# Patient Record
Sex: Female | Born: 1985 | Hispanic: No | Marital: Married | Smoking: Never smoker
Health system: Southern US, Community
[De-identification: ages and names within clinical notes are randomized; demographics above are authoritative.]

---

## 2007-05-09 ENCOUNTER — Ambulatory Visit (HOSPITAL_COMMUNITY): Admission: RE | Admit: 2007-05-09 | Discharge: 2007-05-09 | Payer: Self-pay | Admitting: Obstetrics and Gynecology

## 2007-06-06 ENCOUNTER — Ambulatory Visit (HOSPITAL_COMMUNITY): Admission: RE | Admit: 2007-06-06 | Discharge: 2007-06-06 | Payer: Self-pay | Admitting: Obstetrics and Gynecology

## 2007-07-07 ENCOUNTER — Ambulatory Visit (HOSPITAL_COMMUNITY): Admission: RE | Admit: 2007-07-07 | Discharge: 2007-07-07 | Payer: Self-pay | Admitting: Obstetrics and Gynecology

## 2008-09-01 IMAGING — US US OB DETAIL+14 WK
1 series · 14 of 28 positions shown · non-contrast
Comparison: none

OBSTETRICAL ULTRASOUND:
 This ultrasound was performed in The [HOSPITAL], and the [facility] report will be stored to [REDACTED] PACS.

[Series 1: us ob detail+14 wk · 14 of 67 slices shown]
[im 3/67]
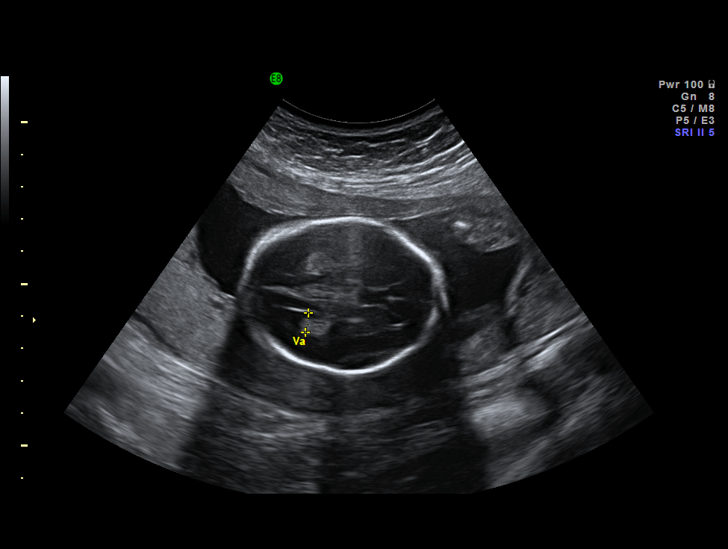
[im 8/67]
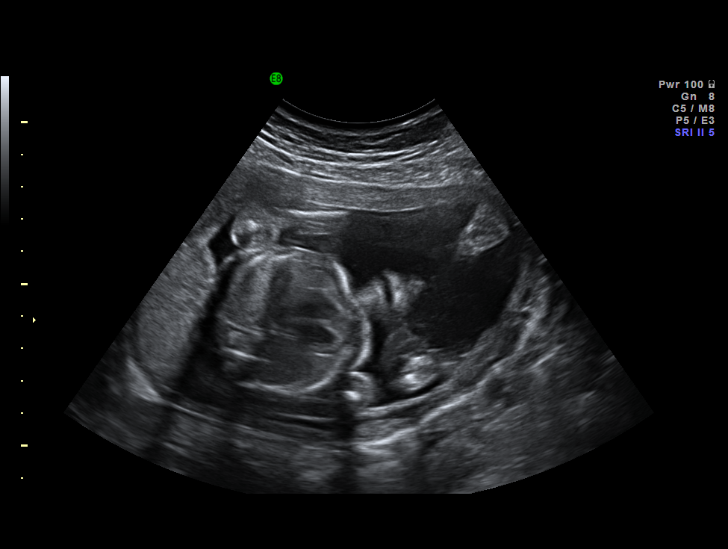
[im 13/67]
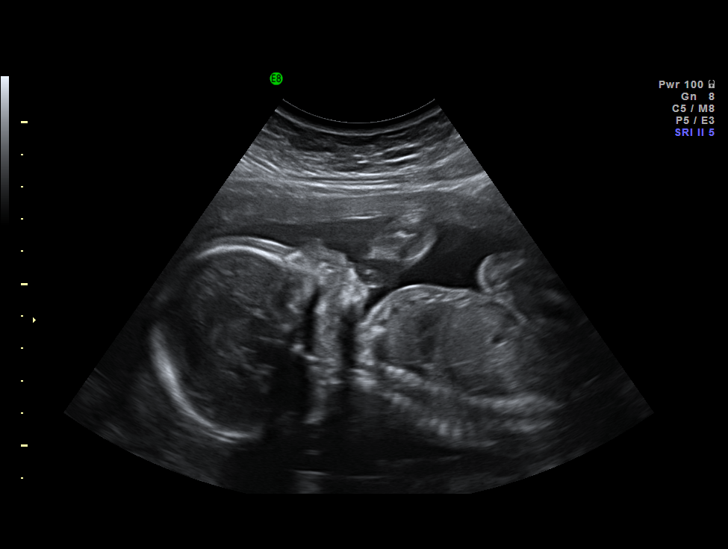
[im 18/67]
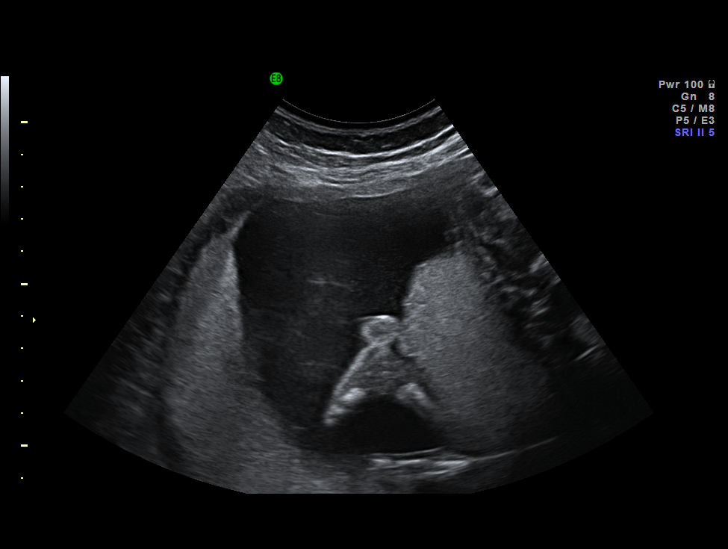
[im 23/67]
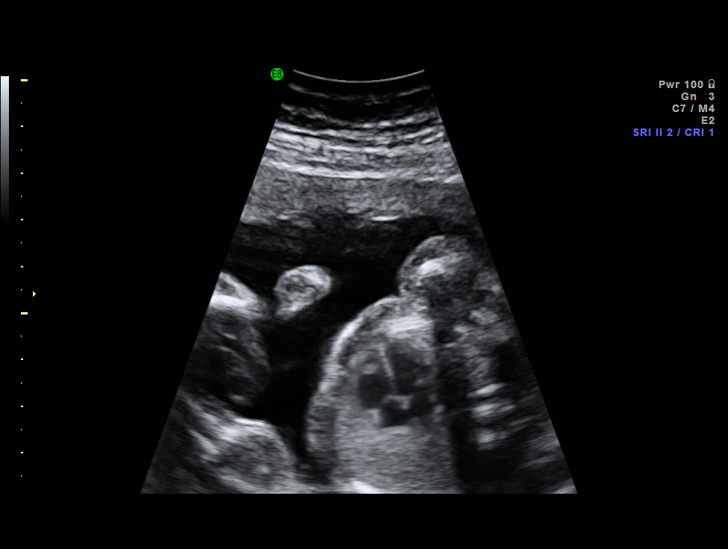
[im 27/67]
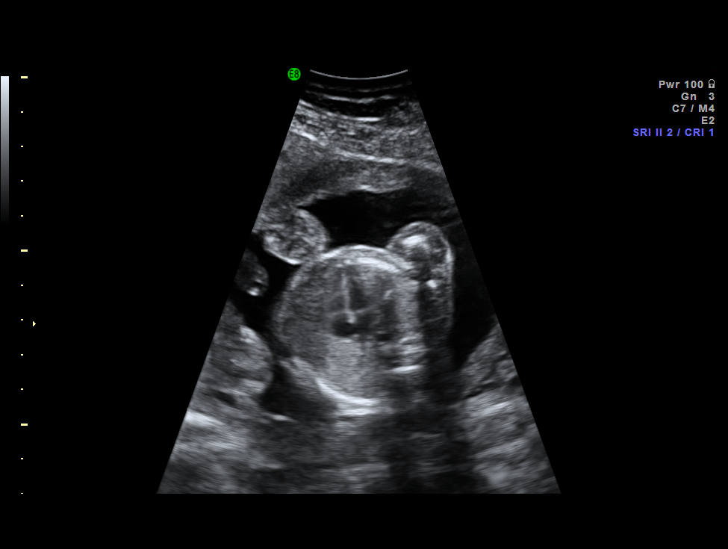
[im 32/67]
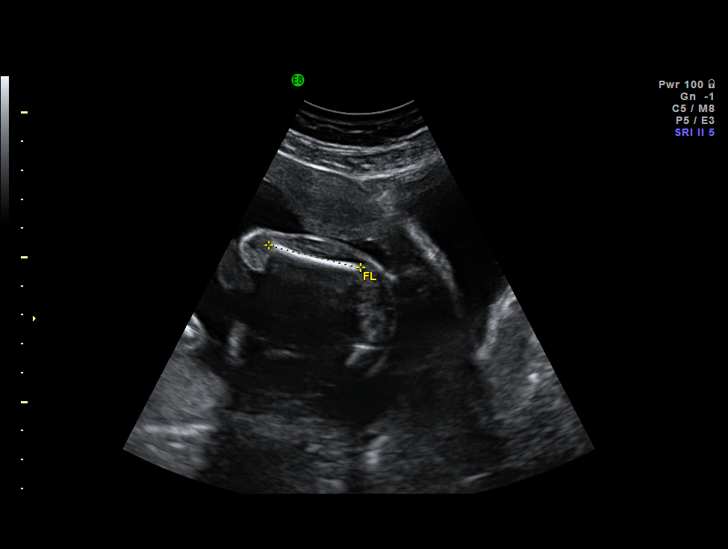
[im 37/67]
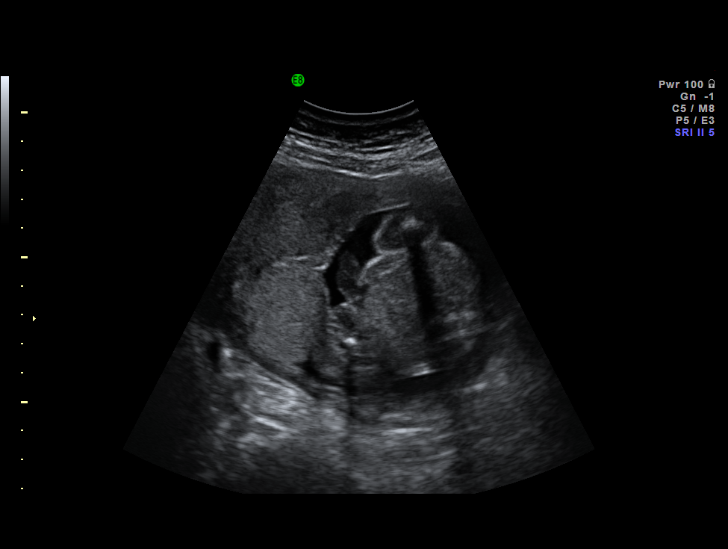
[im 42/67]
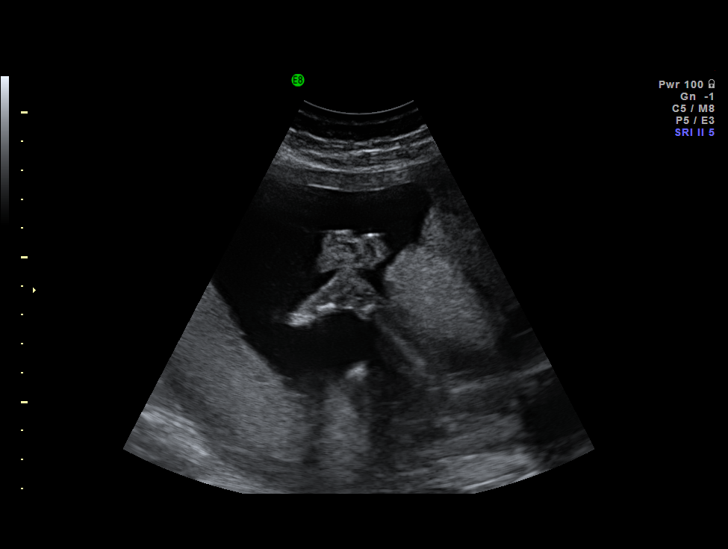
[im 47/67]
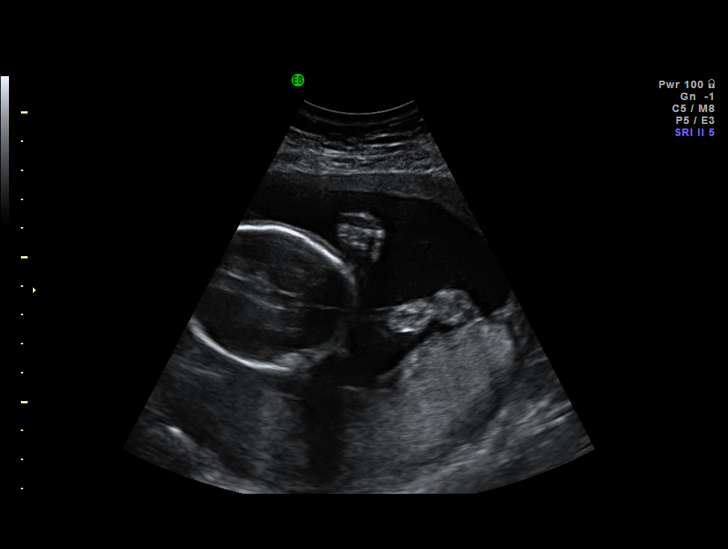
[im 52/67]
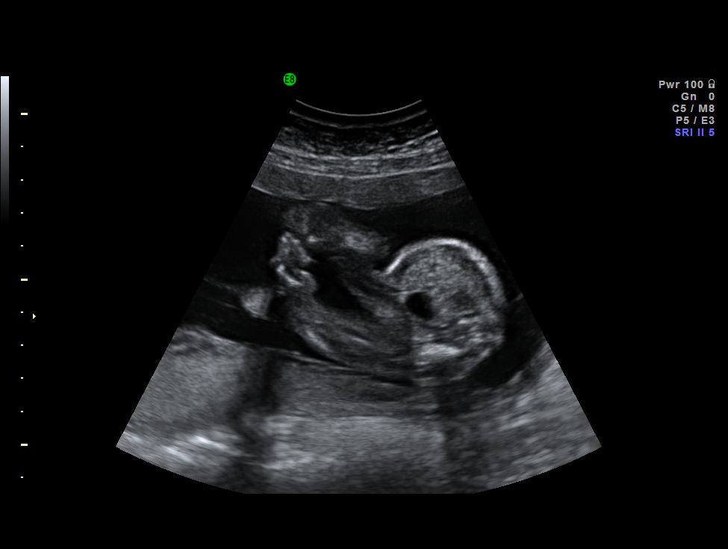
[im 57/67]
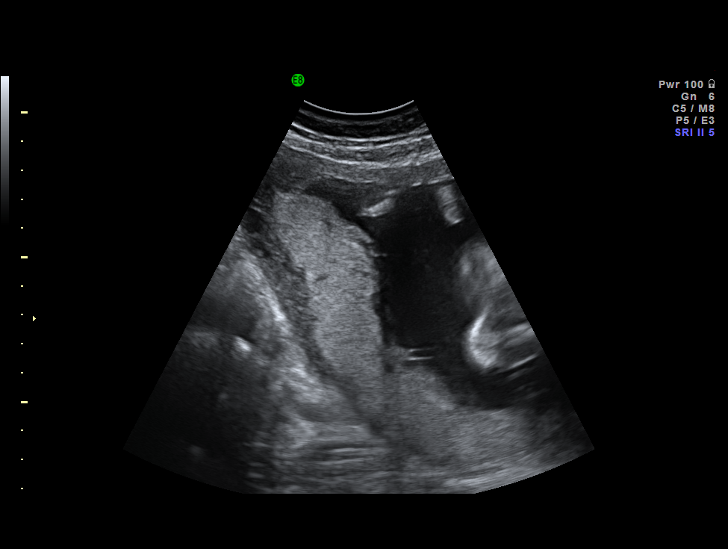
[im 62/67]
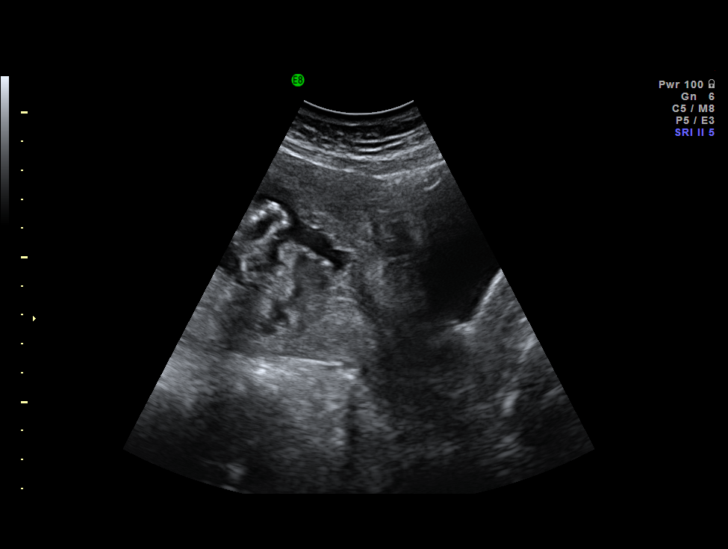
[im 67/67]
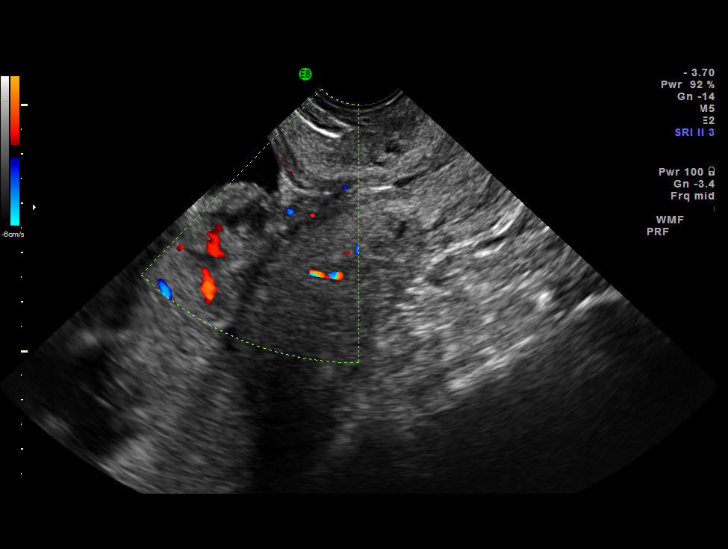

[14 of 28 positions shown; findings below may reference images not displayed]

IMPRESSION: The [facility] report has also been faxed to the ordering physician.

## 2008-10-30 IMAGING — US US OB FOLLOW-UP
1 series · 14 of 28 positions shown · non-contrast
Comparison: none

OBSTETRICAL ULTRASOUND:
 This ultrasound was performed in The [HOSPITAL], and the [facility] report will be stored to [REDACTED] PACS.

[Series 1: us ob follow-up · 14 of 38 slices shown]
[im 2/38]
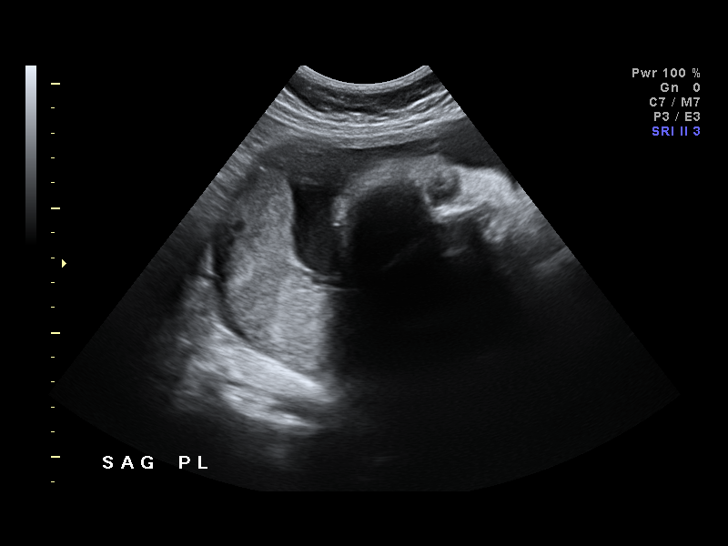
[im 5/38]
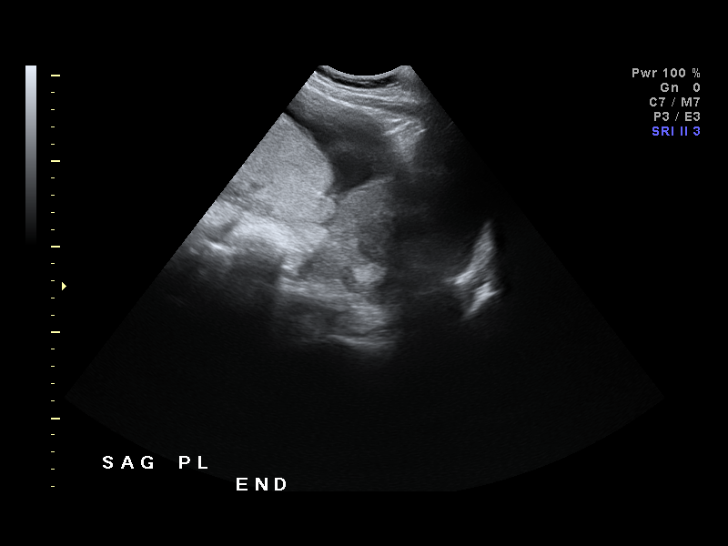
[im 7/38]
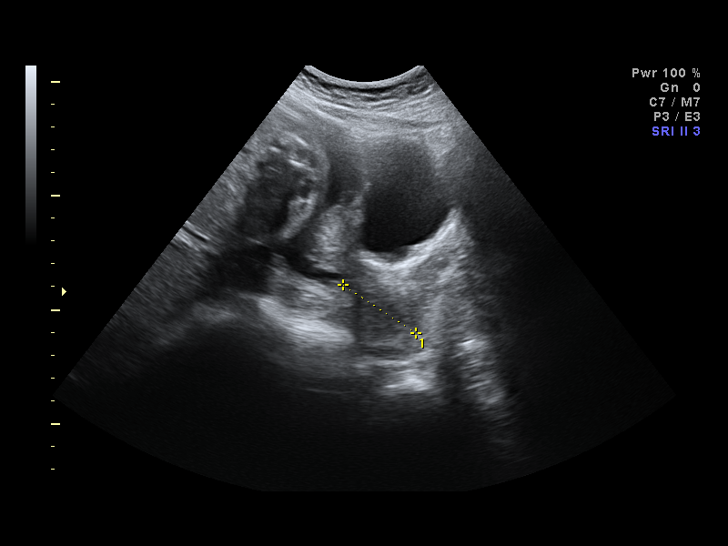
[im 10/38]
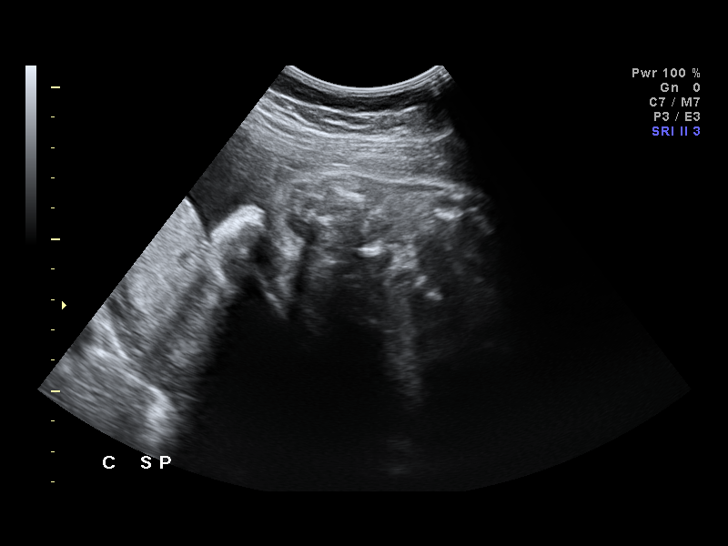
[im 13/38]
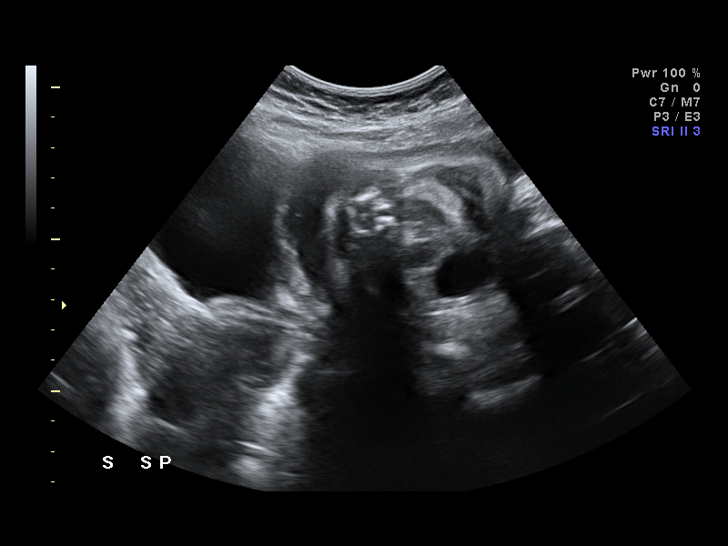
[im 16/38]
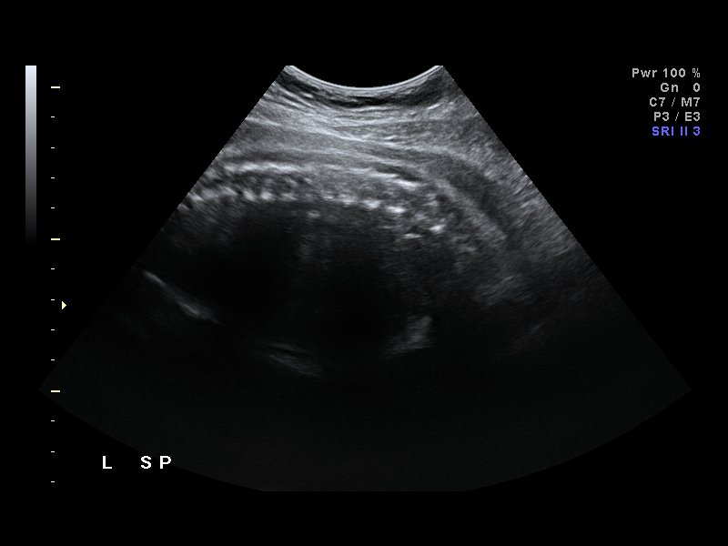
[im 18/38]
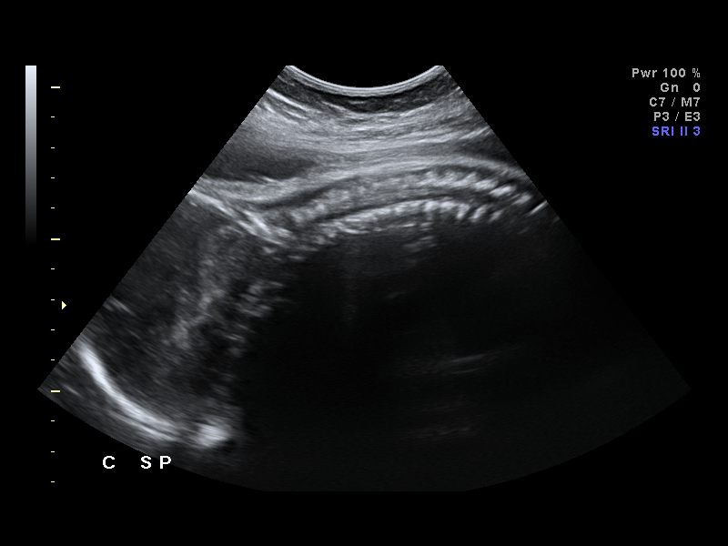
[im 21/38]
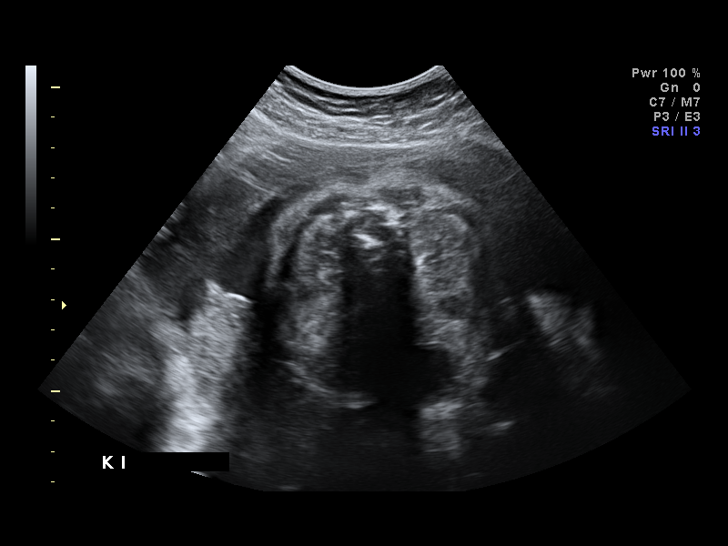
[im 24/38]
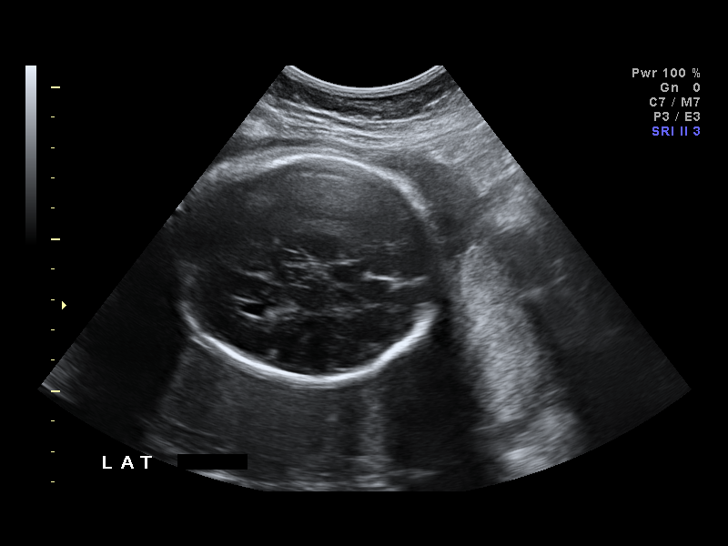
[im 27/38]
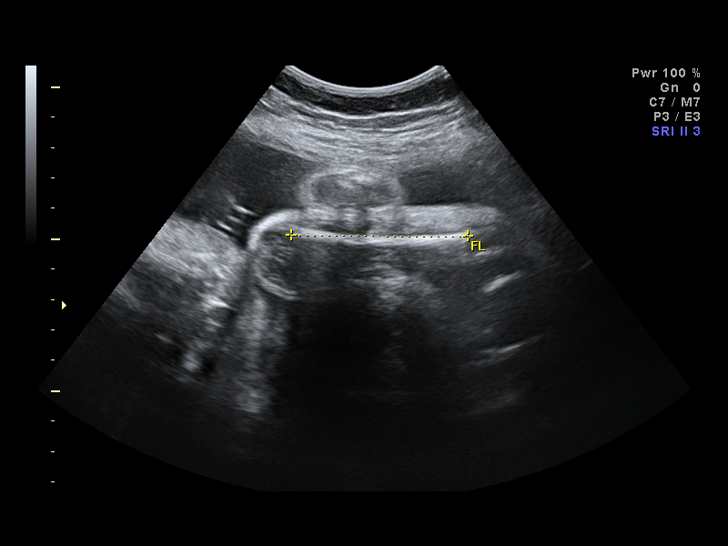
[im 29/38]
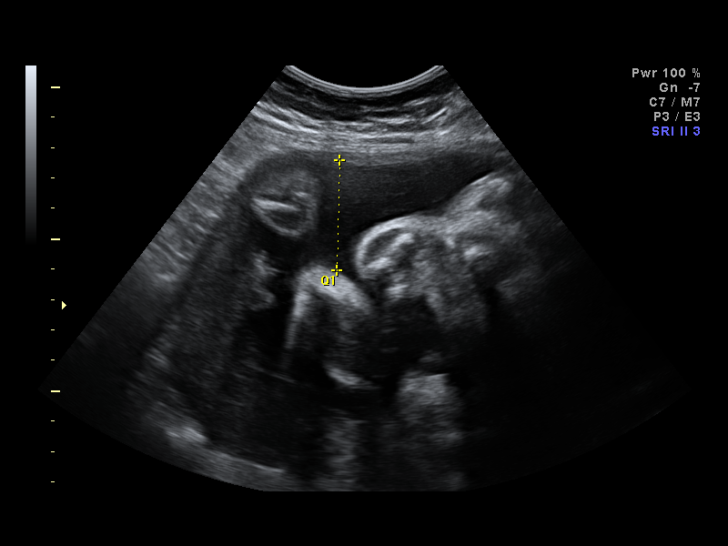
[im 32/38]
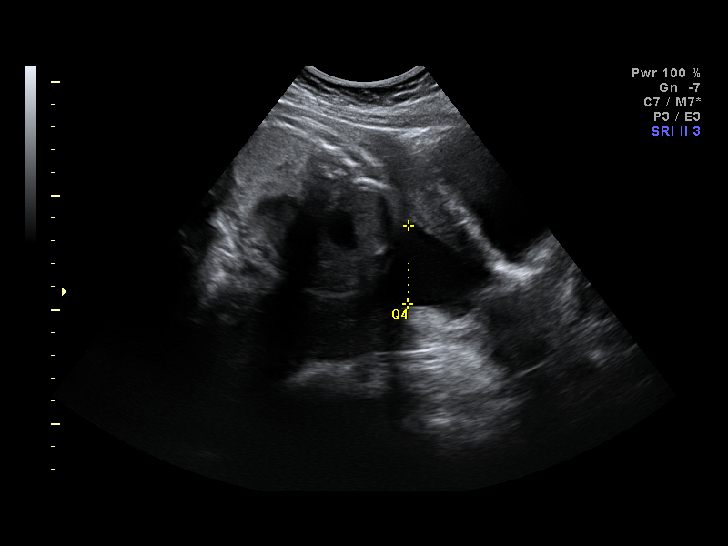
[im 35/38]
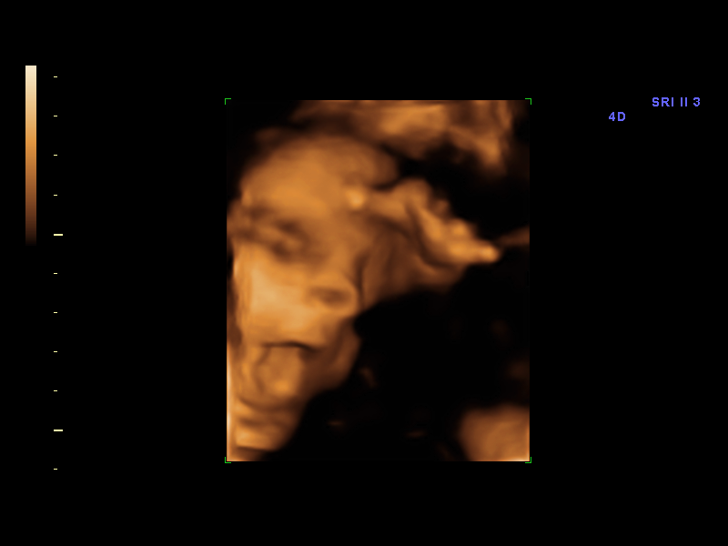
[im 38/38]
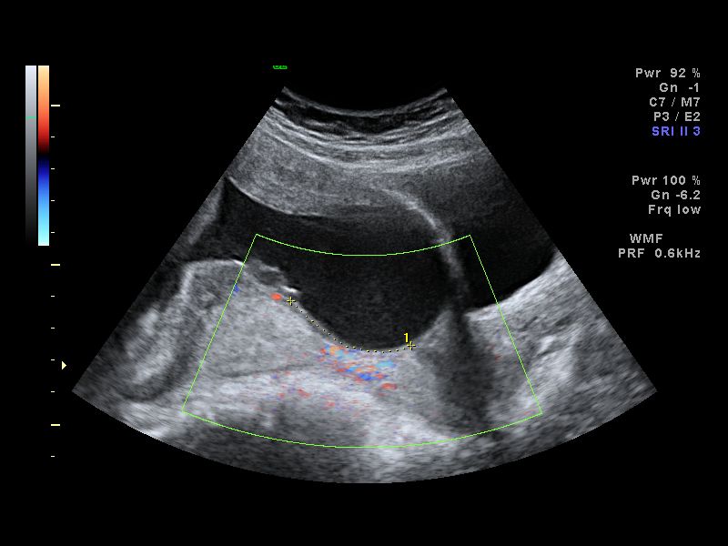

[14 of 28 positions shown; findings below may reference images not displayed]

IMPRESSION: The [facility] report has also been faxed to the ordering physician.

## 2011-09-14 ENCOUNTER — Other Ambulatory Visit (HOSPITAL_COMMUNITY)
Admission: RE | Admit: 2011-09-14 | Discharge: 2011-09-14 | Disposition: A | Discharge status: Home / Self Care | Payer: BC Managed Care – PPO | Source: Ambulatory Visit | Attending: Obstetrics and Gynecology | Admitting: Obstetrics and Gynecology

## 2011-09-14 DIAGNOSIS — Z113 Encounter for screening for infections with a predominantly sexual mode of transmission: Secondary | ICD-10-CM | POA: Insufficient documentation

## 2011-09-14 DIAGNOSIS — Z124 Encounter for screening for malignant neoplasm of cervix: Secondary | ICD-10-CM | POA: Insufficient documentation

## 2011-11-23 ENCOUNTER — Ambulatory Visit: Admit: 2011-11-23 | Payer: Self-pay | Admitting: Obstetrics & Gynecology

## 2011-11-23 SURGERY — DILATATION AND CURETTAGE /HYSTEROSCOPY
Anesthesia: Choice

## 2018-06-10 ENCOUNTER — Emergency Department (HOSPITAL_COMMUNITY)
Admission: EM | Admit: 2018-06-10 | Discharge: 2018-06-11 | Disposition: A | Discharge status: Other Acute Inpatient Hospital | Payer: Managed Care, Other (non HMO) | Attending: Emergency Medicine | Admitting: Emergency Medicine

## 2018-06-10 ENCOUNTER — Encounter: Payer: Self-pay | Admitting: Emergency Medicine

## 2018-06-10 DIAGNOSIS — Z733 Stress, not elsewhere classified: Secondary | ICD-10-CM | POA: Insufficient documentation

## 2018-06-10 DIAGNOSIS — T50902A Poisoning by unspecified drugs, medicaments and biological substances, intentional self-harm, initial encounter: Secondary | ICD-10-CM | POA: Insufficient documentation

## 2018-06-10 DIAGNOSIS — F322 Major depressive disorder, single episode, severe without psychotic features: Secondary | ICD-10-CM | POA: Diagnosis not present

## 2018-06-10 LAB — RAPID URINE DRUG SCREEN, HOSP PERFORMED
Amphetamines: NOT DETECTED
Barbiturates: NOT DETECTED
Benzodiazepines: POSITIVE — AB
Cocaine: NOT DETECTED
Opiates: NOT DETECTED
Tetrahydrocannabinol: NOT DETECTED

## 2018-06-10 LAB — COMPREHENSIVE METABOLIC PANEL
ALT: 17 U/L (ref 0–44)
AST: 15 U/L (ref 15–41)
Albumin: 4.3 g/dL (ref 3.5–5.0)
Alkaline Phosphatase: 52 U/L (ref 38–126)
Anion gap: 9 (ref 5–15)
BILIRUBIN TOTAL: 0.6 mg/dL (ref 0.3–1.2)
BUN: 11 mg/dL (ref 6–20)
CHLORIDE: 106 mmol/L (ref 98–111)
CO2: 25 mmol/L (ref 22–32)
CREATININE: 0.69 mg/dL (ref 0.44–1.00)
Calcium: 8.9 mg/dL (ref 8.9–10.3)
GFR calc Af Amer: >60 mL/min (ref >60)
Glucose, Bld: 94 mg/dL (ref 70–99)
Potassium: 3.5 mmol/L (ref 3.5–5.1)
Sodium: 140 mmol/L (ref 135–145)
Total Protein: 7.3 g/dL (ref 6.5–8.1)

## 2018-06-10 LAB — CBC WITH DIFFERENTIAL/PLATELET
Abs Immature Granulocytes: 0.02 10*3/uL (ref 0.00–0.07)
Basophils Absolute: 0.1 10*3/uL (ref 0.0–0.1)
Basophils Relative: 1 %
EOS ABS: 0 10*3/uL (ref 0.0–0.5)
EOS PCT: 0 %
HEMATOCRIT: 39.6 % (ref 36.0–46.0)
HEMOGLOBIN: 11.4 g/dL — AB (ref 12.0–15.0)
Immature Granulocytes: 0 %
LYMPHS ABS: 2.5 10*3/uL (ref 0.7–4.0)
LYMPHS PCT: 34 %
MCH: 21 pg — AB (ref 26.0–34.0)
MCHC: 28.8 g/dL — AB (ref 30.0–36.0)
MCV: 72.8 fL — ABNORMAL LOW (ref 80.0–100.0)
Monocytes Absolute: 0.7 10*3/uL (ref 0.1–1.0)
Monocytes Relative: 9 %
NRBC: 0 % (ref 0.0–0.2)
Neutro Abs: 4.3 10*3/uL (ref 1.7–7.7)
Neutrophils Relative %: 56 %
Platelets: 392 10*3/uL (ref 150–400)
RBC: 5.44 MIL/uL — AB (ref 3.87–5.11)
RDW: 17.3 % — AB (ref 11.5–15.5)
WBC: 7.6 10*3/uL (ref 4.0–10.5)

## 2018-06-10 LAB — I-STAT BETA HCG BLOOD, ED (MC, WL, AP ONLY): I-stat hCG, quantitative: <5 m[IU]/mL

## 2018-06-10 LAB — ETHANOL

## 2018-06-10 LAB — ACETAMINOPHEN LEVEL: Acetaminophen (Tylenol), Serum: <10 ug/mL — ABNORMAL LOW (ref 10–30)

## 2018-06-10 NOTE — [facility] Counselor (Signed)
TTS left a HIPPA compliant voice mail for patient's husband, Marella Chimes [phone]. Patient states that she just wants him to know she is safe and being evaluated for her depression. Patient reports she would not like him or her children to come see her.

## 2018-06-10 NOTE — ED Provider Notes (Signed)
[facility]-EMERGENCY DEPT Provider Note   CSN: [id number] Arrival date & time: 06/10/18  1552     History   Chief Complaint Chief Complaint  Patient presents with  . Drug Overdose    HPI Kelsey Peters is a 33 y.o. female.  The history is provided by the patient and medical records. No language interpreter was used.  Mental Health Problem  Presenting symptoms: depression, self-mutilation and suicide attempt (overdose)   Presenting symptoms: no aggressive behavior, no agitation, no hallucinations, no homicidal ideas, no suicidal thoughts and no suicidal threats   Degree of incapacity (severity):  Severe Onset quality:  Gradual Duration:  1 day Timing:  Constant Progression:  Waxing and waning Chronicity:  New Context: medication and stressful life event   Context: not alcohol use, not noncompliant and not recent medication change   Treatment compliance:  All of the time Relieved by:  Nothing Worsened by:  Nothing Ineffective treatments:  None tried Associated symptoms: no abdominal pain, no anxiety, no chest pain, no fatigue and no headaches   Risk factors: hx of mental illness (depression)     No past medical history on file.  There are no active problems to display for this patient.     OB History   No obstetric history on file.      Home Medications    Prior to Admission medications   Not on File    Family History No family history on file.  Social History Social History   Tobacco Use  . Smoking status: Not on file  Substance Use Topics  . Alcohol use: Not on file  . Drug use: Not on file     Allergies   Patient has no allergy information on record.   Review of Systems Review of Systems  Constitutional: Negative for chills, diaphoresis, fatigue and fever.  HENT: Negative for congestion, ear pain and sore throat.   Eyes: Negative for pain and visual disturbance.  Respiratory: Negative for cough, chest tightness,  shortness of breath and wheezing.   Cardiovascular: Negative for chest pain, palpitations and leg swelling.  Gastrointestinal: Negative for abdominal pain, constipation, diarrhea, nausea and vomiting.  Genitourinary: Negative for dysuria, enuresis and hematuria.  Musculoskeletal: Negative for arthralgias, back pain, neck pain and neck stiffness.  Skin: Negative for color change, rash and wound.  Neurological: Negative for seizures, syncope, weakness, light-headedness, numbness and headaches.  Psychiatric/Behavioral: Positive for self-injury. Negative for agitation, hallucinations, homicidal ideas and suicidal ideas. The patient is not nervous/anxious.   All other systems reviewed and are negative.    Physical Exam Updated Vital Signs BP 132/89 (BP Location: Right Arm)   Pulse 68   Temp 98.1 F (36.7 C) (Oral)   Resp 20   LMP 06/06/2018 (Approximate)   SpO2 97%   Physical Exam Vitals signs and nursing note reviewed.  Constitutional:      General: She is not in acute distress.    Appearance: She is well-developed. She is not ill-appearing, toxic-appearing or diaphoretic.  HENT:     Head: Normocephalic and atraumatic.     Nose: No congestion.     Mouth/Throat:     Pharynx: No oropharyngeal exudate or posterior oropharyngeal erythema.  Eyes:     Conjunctiva/sclera: Conjunctivae normal.  Neck:     Musculoskeletal: Neck supple.  Cardiovascular:     Rate and Rhythm: Normal rate and regular rhythm.     Heart sounds: No murmur.  Pulmonary:     Effort: Pulmonary effort  is normal. No respiratory distress.     Breath sounds: Normal breath sounds. No wheezing, rhonchi or rales.  Chest:     Chest wall: No tenderness.  Abdominal:     General: There is no distension.     Palpations: Abdomen is soft.     Tenderness: There is no abdominal tenderness. There is no guarding.  Musculoskeletal:        General: No tenderness.  Skin:    General: Skin is warm and dry.     Capillary Refill:  Capillary refill takes less than 2 seconds.  Neurological:     General: No focal deficit present.     Mental Status: She is alert and oriented to person, place, and time.     Sensory: No sensory deficit.     Motor: No weakness.  Psychiatric:        Mood and Affect: Mood is depressed.        Behavior: Behavior is not agitated or aggressive.        Thought Content: Thought content does not include homicidal or suicidal ideation. Thought content does not include homicidal or suicidal plan.     Comments: Patient denies suicidal ideation but does report that she took Ativan today in an attempt to hurt herself.      ED Treatments / Results  Labs (all labs ordered are listed, but only abnormal results are displayed) Labs Reviewed  RAPID URINE DRUG SCREEN, HOSP PERFORMED - Abnormal; Notable for the following components:      Result Value   Benzodiazepines POSITIVE (*)    All other components within normal limits  CBC WITH DIFFERENTIAL/PLATELET - Abnormal; Notable for the following components:   RBC 5.44 (*)    Hemoglobin 11.4 (*)    MCV 72.8 (*)    MCH 21.0 (*)    MCHC 28.8 (*)    RDW 17.3 (*)    All other components within normal limits  ACETAMINOPHEN LEVEL - Abnormal; Notable for the following components:   Acetaminophen (Tylenol), Serum <10 (*)    All other components within normal limits  COMPREHENSIVE METABOLIC PANEL  ETHANOL  I-STAT BETA HCG BLOOD, ED ([facility], [facility], [facility] ONLY)    EKG None  ED ECG REPORT   Date: 06/10/2018  Rate: 73  Rhythm: normal sinus rhythm with sinus arrythmia  QRS Axis: normal  Intervals: normal  ST/T Wave abnormalities: normal  Conduction Disutrbances:none  Narrative Interpretation:   Old EKG Reviewed: none available  I have personally reviewed the EKG tracing and agree with the computerized printout as noted.   Radiology No results found.  Procedures Procedures (including critical care time)  Medications Ordered in ED Medications - No data  to display   Initial Impression / Assessment and Plan / ED Course  I have reviewed the triage vital signs and the nursing notes.  Pertinent labs & imaging results that were available during my care of the patient were reviewed by me and considered in my medical decision making (see chart for details).     Kelsey Peters is a 33 y.o. female with a past medical history significant for depression who presents for drug overdose.  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  She reports that she is essentially been bullied by numerous people including her manager and this escalated today when she was told that she "was not  wanted".  She says that she went home and took 15 tablets of 0.5 mg of Ativan at around 3 PM, 2 hours ago..  She denies any physical complaints at this time and says is been on the medication for years.  She reports that she was caught up in the moment and is very remorseful and regretful of taking the medications.  She denies suicidal ideation at this time.  No other homicidal ideation, other drug use, alcohol use, or hallucinations.  She is very tearful without patient.  She reports that she has not seen a psychiatrist that her PCP has managed her depression in the past.  This is her first time attempting overdose.  On exam, lungs are clear and chest is nontender.  Abdomen is nontender.  No focal neurologic deficits.  Patient is crying.  Patient screening labs were sent and I anticipate she will be medically cleared.   Patient is now medically cleared after work-up is reassuring.  Tylenol level was negative.  TTS consult will be placed.  TTS recommends inpatient management.    Final Clinical Impressions(s) / ED Diagnoses   Final diagnoses:  Intentional drug overdose, initial encounter [facility])    ED Discharge Orders    None      Clinical Impression: 1.  Intentional drug overdose, initial encounter [facility])     Disposition: Patient will await placement with psychiatry for inpatient management of depression and overdose with self-harm intent.    This note was prepared with assistance of Conservation officer, historic buildings. Occasional wrong-word or sound-a-like substitutions may have occurred due to the inherent limitations of voice recognition software.     , Canary Brim, MD 06/10/18 [phone]

## 2018-06-10 NOTE — ED Triage Notes (Signed)
Patient arrived via EMS after she took 34  0.5mg  of Ativan.  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  She resigned last month due to her feeling like they were treating her unfair, but agreed to come back to work temporarily to help out as she needed a job.  Today, patient was involved in an incident in which she felt mistreated at work and took the pills.

## 2018-06-10 NOTE — ED Notes (Signed)
Husband here to visit but patient asleep due to taking Ativan in an overdose amount prior to admission. He left after a brief visit where he sat at her bedside looking at her while she remained asleep. His name is Haroon and he wants to be called at [phone] if she is discharged. Acetaminophen level drawn as ordered at 1930. Will continue to monitor.

## 2018-06-10 NOTE — ED Notes (Signed)
Bed: WA26 Expected date:  Expected time:  Means of arrival:  Comments: EMS-OD 

## 2018-06-10 NOTE — BH Assessment (Signed)
Assessment Note  Kelsey Peters is an 33 y.o. female presenting to [facility] ED via EMS following an intentional overdose on Ativan. Patient reports taking 13 5 mg Ativan today at her job after resigning. Patient states she felt they she was not valued and they were not able to offer her what she needed to stay. Additionally, patient reports issues in her marriage. Patient states she cheated on her husband 3 years ago- causing conflict between her family and her in laws. Patient states she only talked to this individual on the phone and never slept with him. However, due to her [place] culture this behavior was unacceptable. Patient endorses depressive symptoms of worthlessness, hopelessness, guilt, irritability, and a loss of pleasure in activities. Patient is prescribed Effexor and Ativan from her PCP. She does not have a psychiatrist, nor has she ever seen a therapist. Patient cites work stress, marriage issues, and the pressures of being a mother to her 2 children (8 and 10) have caused her to feel exhausted. She expresses fear of the consequences of her overdose with her family. She gave permission for TTS to inform her husband, Marella Chimes [phone] but does not want him visiting her. She states she is concerned about what he will say. Patient denies any substance use, abuse history, or criminal charges. Patient denies HI/AVH.  Patient was alert and oriented x 4. She was dressed in scrubs. Patient was pleasant and cooperative during assessment. She maintained good eye contact. Patient's mood was depressed and affect was congruent. Patient was tearful throughout assessment. Her speech was soft. Thought process was logical and coherent. She did not appear to be responding to internal stimuli or experiencing delusional thought content.  Diagnosis: F32.2 MDD, recurrent severe  Past Medical History: No past medical history on file.   Family History: No family history on file.  Social History:  has no history on  file for tobacco, alcohol, and drug.  Additional Social History:  Alcohol / Drug Use Pain Medications: see MAR Prescriptions: see MAR Over the Counter: see MAR History of alcohol / drug use?: No history of alcohol / drug abuse  CIWA: CIWA-Ar BP: 132/89 Pulse Rate: 68 COWS:    Allergies: Allergies not on file  Home Medications: (Not in a hospital admission)   OB/GYN Status:  Patient's last menstrual period was 06/06/2018 (approximate).  General Assessment Data Location of Assessment: [facility] ED TTS Assessment: In system Is this a Tele or Face-to-Face Assessment?: Face-to-Face Is this an Initial Assessment or a Re-assessment for this encounter?: Initial Assessment Patient Accompanied by:: N/A Language Other than English: No Living Arrangements: (her house) What gender do you identify as?: Female Marital status: Married Pregnancy Status: No Living Arrangements: Spouse/significant other, Children Can pt return to current living arrangement?: Yes Admission Status: Voluntary Is patient capable of signing voluntary admission?: Yes Referral Source: Peters/Family/Friend Insurance type: None     Crisis Care Plan Living Arrangements: Spouse/significant other, Children     Risk to Peters with the past 6 months Suicidal Ideation: Yes-Currently Present Has patient been a risk to Peters within the past 6 months prior to admission? : Yes Suicidal Intent: Yes-Currently Present Has patient had any suicidal intent within the past 6 months prior to admission? : Yes Is patient at risk for suicide?: Yes Suicidal Plan?: Yes-Currently Present Has patient had any suicidal plan within the past 6 months prior to admission? : Yes Specify Current Suicidal Plan: pt overdose Access to Means: Yes Specify Access to Suicidal Means: prescriptions What has  been your use of drugs/alcohol within the last 12 months?: patient denies Previous Attempts/Gestures: Yes How many times?: 1 Other Peters Harm Risks:  none reported Triggers for Past Attempts: Spouse contact Intentional Peters Injurious Behavior: None Family Suicide History: No Recent stressful life event(s): Job Loss, Conflict (Comment)(with husband) Persecutory voices/beliefs?: No Depression: Yes Depression Symptoms: Despondent, Insomnia, Tearfulness, Isolating, Fatigue, Guilt, Loss of interest in usual pleasures, Feeling worthless/Peters pity, Feeling angry/irritable Substance abuse history and/or treatment for substance abuse?: No Suicide prevention information given to non-admitted patients: Not applicable  Risk to Others within the past 6 months Homicidal Ideation: No Does patient have any lifetime risk of violence toward others beyond the six months prior to admission? : No Thoughts of Harm to Others: No Current Homicidal Intent: No Current Homicidal Plan: No Access to Homicidal Means: No Identified Victim: n/a History of harm to others?: No Assessment of Violence: None Noted Violent Behavior Description: none Does patient have access to weapons?: No Criminal Charges Pending?: No Does patient have a court date: No Is patient on probation?: No  Psychosis Hallucinations: None noted Delusions: None noted  Mental Status Report Appearance/Hygiene: In scrubs Eye Contact: Good Motor Activity: Freedom of movement Speech: Soft Level of Consciousness: Alert Mood: Depressed Affect: Depressed Anxiety Level: None Thought Processes: Coherent, Relevant Judgement: Impaired Orientation: Person, Place, Time, Situation Obsessive Compulsive Thoughts/Behaviors: None  Cognitive Functioning Concentration: Normal Memory: Recent Intact, Remote Intact Is patient IDD: No Insight: Fair Impulse Control: Poor Appetite: Good Have you had any weight changes? : No Change Sleep: No Change Total Hours of Sleep: 8 Vegetative Symptoms: None  ADLScreening [facility] Assessment Services) Patient's cognitive ability adequate to safely complete daily  activities?: Yes Patient able to express need for assistance with ADLs?: Yes Independently performs ADLs?: Yes (appropriate for developmental age)  Prior Inpatient Therapy Prior Inpatient Therapy: No  Prior Outpatient Therapy Prior Outpatient Therapy: No Does patient have an ACCT team?: No Does patient have Intensive In-House Services?  : No Does patient have [facility] services? : No Does patient have P4CC services?: No  ADL Screening (condition at time of admission) Patient's cognitive ability adequate to safely complete daily activities?: Yes Is the patient deaf or have difficulty hearing?: No Does the patient have difficulty seeing, even when wearing glasses/contacts?: No Does the patient have difficulty concentrating, remembering, or making decisions?: No Patient able to express need for assistance with ADLs?: Yes Does the patient have difficulty dressing or bathing?: No Independently performs ADLs?: Yes (appropriate for developmental age) Does the patient have difficulty walking or climbing stairs?: No Weakness of Legs: None Weakness of Arms/Hands: None  Home Assistive Devices/Equipment Home Assistive Devices/Equipment: None  Therapy Consults (therapy consults require a physician order) PT Evaluation Needed: No OT Evalulation Needed: No SLP Evaluation Needed: No Abuse/Neglect Assessment (Assessment to be complete while patient is alone) Abuse/Neglect Assessment Can Be Completed: Yes Physical Abuse: Denies Verbal Abuse: Denies Sexual Abuse: Denies Exploitation of patient/patient's resources: Denies Peters-Neglect: Denies Values / Beliefs Cultural Requests During Hospitalization: None Spiritual Requests During Hospitalization: None Consults Spiritual Care Consult Needed: No Social Work Consult Needed: No Merchant navy officerAdvance Directives (For Healthcare) Does Patient Have a Medical Advance Directive?: No Would patient like information on creating a medical advance directive?: No -  Patient declined          Disposition: Malachy Chamberakia Starkes, PMHNP recommends in patient treatment. Disposition Initial Assessment Completed for this Encounter: Yes  On Site Evaluation by:   Reviewed with Physician:    Sharyl NimrodMeredith  Steffani Dionisio 06/10/2018 5:59 PM

## 2018-06-10 NOTE — ED Notes (Signed)
Poison control notified.  They recommended getting a tylenol level 4 hours post ingestion of ativan just to make sure tylenol was not taken along with ativan.

## 2018-06-10 NOTE — ED Notes (Signed)
EKG done @ 1717 per tech and copy on the chart.

## 2018-06-10 NOTE — ED Notes (Signed)
Patient calm and cooperative.  Dr. Rush Landmark at bedside.

## 2018-06-11 ENCOUNTER — Other Ambulatory Visit: Payer: Self-pay

## 2018-06-11 ENCOUNTER — Encounter (HOSPITAL_COMMUNITY): Payer: Self-pay | Admitting: *Deleted

## 2018-06-11 ENCOUNTER — Inpatient Hospital Stay (HOSPITAL_COMMUNITY)
Admission: AD | Admit: 2018-06-11 | Discharge: 2018-06-13 | DRG: 885 | Disposition: A | Discharge status: Home / Self Care | Payer: 59 | Source: Intra-hospital | Attending: Psychiatry | Admitting: Psychiatry

## 2018-06-11 DIAGNOSIS — R45851 Suicidal ideations: Secondary | ICD-10-CM | POA: Diagnosis present

## 2018-06-11 DIAGNOSIS — D509 Iron deficiency anemia, unspecified: Secondary | ICD-10-CM | POA: Diagnosis present

## 2018-06-11 DIAGNOSIS — Z91040 Latex allergy status: Secondary | ICD-10-CM

## 2018-06-11 DIAGNOSIS — F419 Anxiety disorder, unspecified: Secondary | ICD-10-CM | POA: Diagnosis present

## 2018-06-11 DIAGNOSIS — E282 Polycystic ovarian syndrome: Secondary | ICD-10-CM | POA: Diagnosis present

## 2018-06-11 DIAGNOSIS — F332 Major depressive disorder, recurrent severe without psychotic features: Secondary | ICD-10-CM | POA: Diagnosis present

## 2018-06-11 DIAGNOSIS — G4700 Insomnia, unspecified: Secondary | ICD-10-CM | POA: Diagnosis present

## 2018-06-11 DIAGNOSIS — Z560 Unemployment, unspecified: Secondary | ICD-10-CM

## 2018-06-11 DIAGNOSIS — Z7984 Long term (current) use of oral hypoglycemic drugs: Secondary | ICD-10-CM

## 2018-06-11 DIAGNOSIS — Z79899 Other long term (current) drug therapy: Secondary | ICD-10-CM

## 2018-06-11 DIAGNOSIS — Z79891 Long term (current) use of opiate analgesic: Secondary | ICD-10-CM

## 2018-06-11 DIAGNOSIS — Z23 Encounter for immunization: Secondary | ICD-10-CM

## 2018-06-11 DIAGNOSIS — T50902A Poisoning by unspecified drugs, medicaments and biological substances, intentional self-harm, initial encounter: Secondary | ICD-10-CM | POA: Diagnosis not present

## 2018-06-11 DIAGNOSIS — Z915 Personal history of self-harm: Secondary | ICD-10-CM | POA: Diagnosis not present

## 2018-06-11 MED ORDER — ALPRAZOLAM 0.5 MG PO TABS
0.5000 mg | ORAL_TABLET | Freq: Every day | ORAL | Status: DC
  Administered 2018-06-11: 0.5 mg via ORAL
  Filled 2018-06-11: qty 1

## 2018-06-11 MED ORDER — VENLAFAXINE HCL ER 150 MG PO CP24
150.0000 mg | ORAL_CAPSULE | Freq: Every day | ORAL | Status: DC
  Administered 2018-06-11 – 2018-06-13 (×3): 150 mg via ORAL
  Filled 2018-06-11 (×6): qty 1

## 2018-06-11 MED ORDER — METFORMIN HCL 500 MG PO TABS: 1000.0000 mg | ORAL_TABLET | Freq: Two times a day (BID) | ORAL | Status: DC

## 2018-06-11 MED ORDER — HYDROXYZINE HCL 25 MG PO TABS: 25.0000 mg | ORAL_TABLET | Freq: Two times a day (BID) | ORAL | Status: DC | PRN

## 2018-06-11 MED ORDER — INFLUENZA VAC SPLIT QUAD 0.5 ML IM SUSY
0.5000 mL | PREFILLED_SYRINGE | INTRAMUSCULAR | Status: AC
  Administered 2018-06-13: 0.5 mL via INTRAMUSCULAR
  Filled 2018-06-11: qty 0.5

## 2018-06-11 MED ORDER — LORAZEPAM 0.5 MG PO TABS
0.5000 mg | ORAL_TABLET | Freq: Three times a day (TID) | ORAL | Status: DC | PRN
  Administered 2018-06-11 – 2018-06-12 (×2): 0.5 mg via ORAL
  Filled 2018-06-11 (×2): qty 1

## 2018-06-11 MED ORDER — ALUM & MAG HYDROXIDE-SIMETH 200-200-20 MG/5ML PO SUSP: 30.0000 mL | ORAL | Status: DC | PRN

## 2018-06-11 MED ORDER — TRAZODONE HCL 50 MG PO TABS
50.0000 mg | ORAL_TABLET | Freq: Every evening | ORAL | Status: DC | PRN
  Administered 2018-06-11 – 2018-06-12 (×2): 50 mg via ORAL
  Filled 2018-06-11 (×2): qty 1

## 2018-06-11 MED ORDER — METFORMIN HCL 500 MG PO TABS
1000.0000 mg | ORAL_TABLET | Freq: Two times a day (BID) | ORAL | Status: DC
  Administered 2018-06-11 – 2018-06-13 (×4): 1000 mg via ORAL
  Filled 2018-06-11 (×8): qty 2

## 2018-06-11 MED ORDER — ACETAMINOPHEN 325 MG PO TABS: 650.0000 mg | ORAL_TABLET | Freq: Four times a day (QID) | ORAL | Status: DC | PRN

## 2018-06-11 MED ORDER — ALPRAZOLAM 0.5 MG PO TABS: 0.5000 mg | ORAL_TABLET | Freq: Every day | ORAL | Status: DC

## 2018-06-11 MED ORDER — MAGNESIUM HYDROXIDE 400 MG/5ML PO SUSP: 30.0000 mL | Freq: Every day | ORAL | Status: DC | PRN

## 2018-06-11 MED ORDER — VENLAFAXINE HCL ER 150 MG PO CP24
150.0000 mg | ORAL_CAPSULE | Freq: Every day | ORAL | Status: DC
  Filled 2018-06-11: qty 1

## 2018-06-11 NOTE — ED Notes (Signed)
Awake and asking for something for her sweating. No meds ordered at this time. Offered a cold pack which she declined. Offered her a cold drink which she accepted and drank. Informed her of her husbands visit earlier in the shift when she was asleep. She offered to call him in the am and returned to sleep.

## 2018-06-11 NOTE — Progress Notes (Signed)
Admission note:  Patient is a 33 yo female admitted to [facility] from the ED due to an intentional overdose of ativan.  It should be noted that during the admission, patient omitted this and is minimizes her depression.  She presents with a tearful, flat affect.  She states that she is having a difficult time at her job due to her coworkers excluding her from meetings, training, etc.  She states she was having difficulty with her boss and she finally resigned.  She states, "I feel they were discriminating against me because I'm different."  She states she would ask for help with a wire transfer and her coworker made a comment about her in front of a customer.  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  She denies any thoughts of self harm.  She lives in [place] with her husband and two children.  She states, "Our marriage was arranged.  He is a good man."  She states she cheated on him 3 or 4 years ago, and she feels guilt over it.  She said the relationship lasted about 8 or 9 months.  She states, "he has forgiven me, but I haven't let it go."  She is taking ativan 0.5 mg 2X daily as needed.  She denies abusing any drugs or alcohol.  She uses no tobacco products.  This is patient's first psych admission.  She was oriented to room and unit.

## 2018-06-11 NOTE — H&P (Addendum)
Psychiatric Admission Assessment Adult  Patient Identification: Kelsey Peters MRN:  [id number] Date of Evaluation:  06/11/2018 Chief Complaint:  " I just got very frustrated" Principal Diagnosis: MDD, Status Post BZD Overdose  Diagnosis:  Active Problems:   MDD (major depressive disorder), recurrent severe, without psychosis (HCC)  History of Present Illness: 33 year old married female, presented to ED on 1/7 via EMS ( apparently contacted by a coworker) following an overdose on prescribed Ativan. Patient states she is unsure how many she took, states " whatever was left in the bottle". She is prescribed Ativan 0.5 mgrs BID which she states she takes irregularly and denies any pattern of abuse or misuse. Overdose was impulsive and unplanned. States " I just got very frustrated at work".  Does endorse depression and some neuro-vegetative symptoms as below. Reports she does not feel overdose was suicidal in intent, but rather " just to calm down ". Denies prior suicidal ideations. Denies psychotic symptoms.  States she feels tension at work , and feels that after initially being promised training and promotions she has been tasked with menial tasks, which she feels is related to her being from a different culture, due to which she had recently resigned. Other stressors include her father being medically ill and currently hospitalized in [place] Associated Signs/Symptoms: Depression Symptoms:  depressed mood, anhedonia, suicidal attempt, anxiety, loss of energy/fatigue, (Hypo) Manic Symptoms:  denies  Anxiety Symptoms:  reports she worries excessively , denies panic attacks Psychotic Symptoms: denies  PTSD Symptoms: Does not endorse Total Time spent with patient: 45 minutes  Past Psychiatric History: no prior psychiatric admissions. No history of suicide attempts. Denies history of self cutting . Denies history of psychosis. No history of mania or hypomania, no history of  PTSD  States she was started  on psychiatric medications by her PCP following a period of marital tension which caused depression, anxiety about three years ago, and has been on these medications ( Effexor, Ativan) for 2-3 years. Denies history of violence    Is the patient at risk to self? Yes.    Has the patient been a risk to self in the past 6 months? No.  Has the patient been a risk to self within the distant past? No.  Is the patient a risk to others? No.  Has the patient been a risk to others in the past 6 months? No.  Has the patient been a risk to others within the distant past? No.   Prior Inpatient Therapy:  denies  Prior Outpatient Therapy:   reports psychiatric medications have been prescribed by her PCP Alcohol Screening:   Substance Abuse History in the last 12 months: denies alcohol or drug abuse , denies BZD abuse . Consequences of Substance Abuse: Denies  Previous Psychotropic Medications: Has been on Effexor XR  X 3 years- unsure of dose but thinks it is 300 mgrs daily , Ativan 0.5 mgrs BID PRN which she states she takes about 2 x week, denies abusing .  States she has not been prescribed other medications in the past . Psychological Evaluations: No  Past Medical History: PCOS, for which she takes Metformin 1000 mgrs BID. Allergic to Latex, NKDA. Family History: parents alive, live in [place], has one brother also in [place], and has a sister who lives out of the country Family Psychiatric  History: Denies history of mental illness in family. No suicides in family. No alcohol use disorder in family  Tobacco Screening:   does not smoke  or use tobacco products. Social History: 30, married, has two children ages 71,8, who are currently with husband . Currently unemployed. Social History   Substance and Sexual Activity  Alcohol Use Not on file     Social History   Substance and Sexual Activity  Drug Use Not on file    Additional Social History:  Allergies:  Not on File Lab Results:  Results for orders  placed or performed during the hospital encounter of 06/10/18 (from the past 48 hour(s))  Comprehensive metabolic panel     Status: None   Collection Time: 06/10/18  4:20 PM  Result Value Ref Range   Sodium 140 135 - 145 mmol/L   Potassium 3.5 3.5 - 5.1 mmol/L   Chloride 106 98 - 111 mmol/L   CO2 25 22 - 32 mmol/L   Glucose, Bld 94 70 - 99 mg/dL   BUN 11 6 - 20 mg/dL   Creatinine, Ser 1.50 0.44 - 1.00 mg/dL   Calcium 8.9 8.9 - 56.9 mg/dL   Total Protein 7.3 6.5 - 8.1 g/dL   Albumin 4.3 3.5 - 5.0 g/dL   AST 15 15 - 41 U/L   ALT 17 0 - 44 U/L   Alkaline Phosphatase 52 38 - 126 U/L   Total Bilirubin 0.6 0.3 - 1.2 mg/dL   GFR calc non Af Amer >60 >60 mL/min   GFR calc Af Amer >60 >60 mL/min   Anion gap 9 5 - 15    Comment: Performed at [facility], [address] [place], [place] [address]  Ethanol     Status: None   Collection Time: 06/10/18  4:20 PM  Result Value Ref Range   Alcohol, Ethyl (B) <10 <10 mg/dL    Comment: (NOTE) Lowest detectable limit for serum alcohol is 10 mg/dL. For medical purposes only. Performed at [facility], [address] [place], [place] [address]   CBC with Diff     Status: Abnormal   Collection Time: 06/10/18  4:20 PM  Result Value Ref Range   WBC 7.6 4.0 - 10.5 K/uL   RBC 5.44 (H) 3.87 - 5.11 MIL/uL   Hemoglobin 11.4 (L) 12.0 - 15.0 g/dL   HCT 74.8 27.0 - 78.6 %   MCV 72.8 (L) 80.0 - 100.0 fL   MCH 21.0 (L) 26.0 - 34.0 pg   MCHC 28.8 (L) 30.0 - 36.0 g/dL   RDW 75.4 (H) 49.2 - 01.0 %   Platelets 392 150 - 400 K/uL   nRBC 0.0 0.0 - 0.2 %   Neutrophils Relative % 56 %   Neutro Abs 4.3 1.7 - 7.7 K/uL   Lymphocytes Relative 34 %   Lymphs Abs 2.5 0.7 - 4.0 K/uL   Monocytes Relative 9 %   Monocytes Absolute 0.7 0.1 - 1.0 K/uL   Eosinophils Relative 0 %   Eosinophils Absolute 0.0 0.0 - 0.5 K/uL   Basophils Relative 1 %   Basophils Absolute 0.1 0.0 - 0.1 K/uL   Immature Granulocytes 0 %   Abs Immature  Granulocytes 0.02 0.00 - 0.07 K/uL    Comment: Performed at [facility], [address], [place], [place] [address]  Urine rapid drug screen (hosp performed)     Status: Abnormal   Collection Time: 06/10/18  4:37 PM  Result Value Ref Range   Opiates NONE DETECTED NONE DETECTED   Cocaine NONE DETECTED NONE DETECTED   Benzodiazepines POSITIVE (A) NONE DETECTED   Amphetamines  NONE DETECTED NONE DETECTED   Tetrahydrocannabinol NONE DETECTED NONE DETECTED   Barbiturates NONE DETECTED NONE DETECTED    Comment: (NOTE) DRUG SCREEN FOR MEDICAL PURPOSES ONLY.  IF CONFIRMATION IS NEEDED FOR ANY PURPOSE, NOTIFY LAB WITHIN 5 DAYS. LOWEST DETECTABLE LIMITS FOR URINE DRUG SCREEN Drug Class                     Cutoff (ng/mL) Amphetamine and metabolites    1000 Barbiturate and metabolites    200 Benzodiazepine                 200 Tricyclics and metabolites     300 Opiates and metabolites        300 Cocaine and metabolites        300 THC                            50 Performed at [facility], [address] [place], [place] [address]   I-Stat beta hCG blood, ED     Status: None   Collection Time: 06/10/18  4:43 PM  Result Value Ref Range   I-stat hCG, quantitative <5.0 <5 mIU/mL   Comment 3            Comment:   GEST. AGE      CONC.  (mIU/mL)   <=1 WEEK        5 - 50     2 WEEKS       50 - 500     3 WEEKS       100 - 10,000     4 WEEKS     1,000 - 30,000        FEMALE AND NON-PREGNANT FEMALE:     LESS THAN 5 mIU/mL   Acetaminophen level     Status: Abnormal   Collection Time: 06/10/18  7:36 PM  Result Value Ref Range   Acetaminophen (Tylenol), Serum <10 (L) 10 - 30 ug/mL    Comment: (NOTE) Therapeutic concentrations vary significantly. A range of 10-30 ug/mL  may be an effective concentration for many patients. However, some  are best treated at concentrations outside of this range. Acetaminophen concentrations >150 ug/mL at 4 hours after  ingestion  and >50 ug/mL at 12 hours after ingestion are often associated with  toxic reactions. Performed at [facility], [address] [place], [place] [address]     Blood Alcohol level:  Lab Results  Component Value Date   ETH <10 06/10/2018    Metabolic Disorder Labs:  No results found for: HGBA1C, MPG No results found for: PROLACTIN No results found for: CHOL, TRIG, HDL, CHOLHDL, VLDL, LDLCALC  Current Medications: Current Facility-Administered Medications  Medication Dose Route Frequency Provider Last Rate Last Dose  . acetaminophen (TYLENOL) tablet 650 mg  650 mg Oral Q6H PRN Laveda AbbeParks, Laurie Britton, NP      . Melene Muller[START ON 06/12/2018] ALPRAZolam Prudy Feeler(XANAX) tablet 0.5 mg  0.5 mg Oral Daily Laveda AbbeParks, Laurie Britton, NP      . alum & mag hydroxide-simeth (MAALOX/MYLANTA) 200-200-20 MG/5ML suspension 30 mL  30 mL Oral Q4H PRN Laveda AbbeParks, Laurie Britton, NP      . hydrOXYzine (ATARAX/VISTARIL) tablet 25 mg  25 mg Oral BID PRN Laveda AbbeParks, Laurie Britton, NP      . magnesium hydroxide (MILK OF MAGNESIA) suspension 30 mL  30 mL Oral Daily PRN Laveda AbbeParks, Laurie Britton, NP      .  metFORMIN (GLUCOPHAGE) tablet 1,000 mg  1,000 mg Oral BID WC Laveda Abbe, NP      . traZODone (DESYREL) tablet 50 mg  50 mg Oral QHS PRN Laveda Abbe, NP       PTA Medications: Medications Prior to Admission  Medication Sig Dispense Refill Last Dose  . LORazepam (ATIVAN) 0.5 MG tablet Take 0.25-0.5 mg by mouth 2 (two) times daily as needed for anxiety.   06/10/2018 at Unknown time  . metFORMIN (GLUCOPHAGE) 1000 MG tablet Take 1,000 mg by mouth 2 (two) times daily with a meal.   06/09/2018 at Unknown time    Musculoskeletal: Strength & Muscle Tone: within normal limits Gait & Station: normal Patient leans: N/A  Psychiatric Specialty Exam: Physical Exam  Review of Systems  Constitutional: Negative.   HENT: Negative.   Eyes: Negative.   Respiratory: Negative.   Cardiovascular: Negative.    Gastrointestinal: Negative for abdominal pain, blood in stool, nausea and vomiting.  Genitourinary: Negative.   Musculoskeletal: Negative.   Skin: Negative.   Neurological: Negative for seizures and headaches.  Endo/Heme/Allergies: Negative.   Psychiatric/Behavioral: Positive for depression and suicidal ideas.  All other systems reviewed and are negative.   Blood pressure 129/79, pulse (!) 117, temperature 98.4 F (36.9 C), temperature source Oral, resp. rate 18, height 5\' 4"  (1.626 m), weight 69.9 kg, last menstrual period 06/06/2018, SpO2 99 %.Body mass index is 26.43 kg/m.  General Appearance: Fairly Groomed  Eye Contact:  Fair  Speech:  Normal Rate  Volume:  Decreased  Mood:  Depressed  Affect:  vaguely constricted   Thought Process:  Linear and Descriptions of Associations: Intact  Orientation:  Full (Time, Place, and Person)  Thought Content:  no hallucinations, no delusions, not internally preoccupied  Suicidal Thoughts:  No denies any suicidal or self injurious ideations at this time and contracts for safety on unit, denies homicidal or violent ideations , and specifically denies homicidal or violent ideations towards coworkers   Homicidal Thoughts:  No  Memory:  recent and remote grossly intact   Judgement:  Fair  Insight:  Fair  Psychomotor Activity:  Decreased  Concentration:  Concentration: Good and Attention Span: Good  Recall:  Good  Fund of Knowledge:  Good  Language:  Good  Akathisia:  Negative  Handed:  Right  AIMS (if indicated):     Assets:  Communication Skills Desire for Improvement Resilience  ADL's:  Intact  Cognition:  WNL  Sleep:       Treatment Plan Summary: Daily contact with patient to assess and evaluate symptoms and progress in treatment, Medication management, Plan inpatient treatment  and medications as below  Observation Level/Precautions:  15 minute checks  Laboratory:  TSH, Anemia Workup, as Hgb slightly low and MCV low   Psychotherapy:  Milieu, group therapy  Medications:  We discussed options . She reports she has been on Effexor for several years and reports she has had WDL symptoms when she stops it for more than 2-3 days. She has also been on Ativan 0.5 mgrs BID which she takes irregularly, denies abuse . Continue Ativan 0.5 mgrs TID PRN for anxiety as needed Patient is unsure of Effexor dose she is on, but states she is interested in tapering off ( gradually to minimize WDL) and consider another antidepressant option. Continue Effexor XR at 150 mgrs QDAY for now Continue Metformin for PCOS , which she states she tolerates well without side effects  Consultations:  As needed   Discharge  Concerns:  -  Estimated LOS:  Other:     Physician Treatment Plan for Primary Diagnosis: S/P Suicidal Attempt by Overdose Long Term Goal(s): Improvement in symptoms so as ready for discharge  Short Term Goals: Ability to identify changes in lifestyle to reduce recurrence of condition will improve, Ability to verbalize feelings will improve, Ability to disclose and discuss suicidal ideas, Ability to demonstrate self-control will improve and Ability to identify and develop effective coping behaviors will improve  Physician Treatment Plan for Secondary Diagnosis: Active Problems:   MDD (major depressive disorder), recurrent severe, without psychosis (HCC)  Long Term Goal(s): Improvement in symptoms so as ready for discharge  Short Term Goals: Ability to identify changes in lifestyle to reduce recurrence of condition will improve, Ability to verbalize feelings will improve, Ability to disclose and discuss suicidal ideas, Ability to demonstrate self-control will improve, Ability to identify and develop effective coping behaviors will improve and Ability to maintain clinical measurements within normal limits will improve  I certify that inpatient services furnished can reasonably be expected to improve the patient's condition.     Craige CottaFernando A Grayling Schranz, MD 1/8/20203:53 PM

## 2018-06-11 NOTE — BH Assessment (Signed)
BHH Assessment Progress Note  Per Donell SievertSpencer Simon, PA, this pt requires psychiatric hospitalization at this time.  Berneice Heinrichina Tate, RN, [id number]; [facility] after 13:00.  Pt has signed Voluntary Admission and Consent for Treatment, as well as Consent to Release Information to her PCP, and signed forms [facility].  Pt's nurse has been notified, and agrees to send original paperwork along with pt via [place], and to call report to [phone].  Doylene Canninghomas Koralee Wedeking, [place] Behavioral Health Coordinator [phone]

## 2018-06-11 NOTE — Progress Notes (Addendum)
Patient ID: Kelsey Peters, female   DOB: 1985-06-21, 33 y.o.   MRN: [id number]  Pt was seen and chart reviewed with treatment team and Dr Sharma Covert. Pt was accepted to [facility] on night shift pending discharges on 06-11-2018. Pt has signed her voluntary paperwork and will be transported when [facility] is ready to accept her.   Laveda Abbe, NP-C 06-11-2018     1103  Patient's chart reviewed and case discussed with the physician extender and developed treatment plan. Reviewed the information documented and agree with the treatment plan.  Juanetta Beets, DO 06/11/18 11:23 AM

## 2018-06-11 NOTE — ED Notes (Signed)
ED TO INPATIENT HANDOFF REPORT  Name/Age/Gender Kelsey Peters 33 y.o. female  Code Status   Home/SNF/Other Home  Chief Complaint Overdose  Level of Care/Admitting Diagnosis ED Disposition    None      Medical History No past medical history on file.  Allergies Not on File  IV Location/Drains/Wounds Patient Lines/Drains/Airways Status   Active Line/Drains/Airways    None          Labs/Imaging Results for orders placed or performed during the hospital encounter of 06/10/18 (from the past 48 hour(s))  Comprehensive metabolic panel     Status: None   Collection Time: 06/10/18  4:20 PM  Result Value Ref Range   Sodium 140 135 - 145 mmol/L   Potassium 3.5 3.5 - 5.1 mmol/L   Chloride 106 98 - 111 mmol/L   CO2 25 22 - 32 mmol/L   Glucose, Bld 94 70 - 99 mg/dL   BUN 11 6 - 20 mg/dL   Creatinine, Ser 5.92 0.44 - 1.00 mg/dL   Calcium 8.9 8.9 - 92.4 mg/dL   Total Protein 7.3 6.5 - 8.1 g/dL   Albumin 4.3 3.5 - 5.0 g/dL   AST 15 15 - 41 U/L   ALT 17 0 - 44 U/L   Alkaline Phosphatase 52 38 - 126 U/L   Total Bilirubin 0.6 0.3 - 1.2 mg/dL   GFR calc non Af Amer >60 >60 mL/min   GFR calc Af Amer >60 >60 mL/min   Anion gap 9 5 - 15    Comment: Performed at [facility], [address] [place], [place] [address]  Ethanol     Status: None   Collection Time: 06/10/18  4:20 PM  Result Value Ref Range   Alcohol, Ethyl (B) <10 <10 mg/dL    Comment: (NOTE) Lowest detectable limit for serum alcohol is 10 mg/dL. For medical purposes only. Performed at [facility], [address] [place], [place] [address]   CBC with Diff     Status: Abnormal   Collection Time: 06/10/18  4:20 PM  Result Value Ref Range   WBC 7.6 4.0 - 10.5 K/uL   RBC 5.44 (H) 3.87 - 5.11 MIL/uL   Hemoglobin 11.4 (L) 12.0 - 15.0 g/dL   HCT 11.6 57.9 - 03.8 %   MCV 72.8 (L) 80.0 - 100.0 fL   MCH 21.0 (L) 26.0 - 34.0 pg   MCHC 28.8 (L) 30.0 - 36.0 g/dL   RDW 33.3  (H) 83.2 - 15.5 %   Platelets 392 150 - 400 K/uL   nRBC 0.0 0.0 - 0.2 %   Neutrophils Relative % 56 %   Neutro Abs 4.3 1.7 - 7.7 K/uL   Lymphocytes Relative 34 %   Lymphs Abs 2.5 0.7 - 4.0 K/uL   Monocytes Relative 9 %   Monocytes Absolute 0.7 0.1 - 1.0 K/uL   Eosinophils Relative 0 %   Eosinophils Absolute 0.0 0.0 - 0.5 K/uL   Basophils Relative 1 %   Basophils Absolute 0.1 0.0 - 0.1 K/uL   Immature Granulocytes 0 %   Abs Immature Granulocytes 0.02 0.00 - 0.07 K/uL    Comment: Performed at [facility], [address], [place], [place] [address]  Urine rapid drug screen (hosp performed)     Status: Abnormal   Collection Time: 06/10/18  4:37 PM  Result Value Ref Range   Opiates NONE DETECTED NONE DETECTED   Cocaine NONE DETECTED NONE DETECTED   Benzodiazepines  POSITIVE (A) NONE DETECTED   Amphetamines NONE DETECTED NONE DETECTED   Tetrahydrocannabinol NONE DETECTED NONE DETECTED   Barbiturates NONE DETECTED NONE DETECTED    Comment: (NOTE) DRUG SCREEN FOR MEDICAL PURPOSES ONLY.  IF CONFIRMATION IS NEEDED FOR ANY PURPOSE, NOTIFY LAB WITHIN 5 DAYS. LOWEST DETECTABLE LIMITS FOR URINE DRUG SCREEN Drug Class                     Cutoff (ng/mL) Amphetamine and metabolites    1000 Barbiturate and metabolites    200 Benzodiazepine                 200 Tricyclics and metabolites     300 Opiates and metabolites        300 Cocaine and metabolites        300 THC                            50 Performed at [facility], [address] [place], [place] [address]   I-Stat beta hCG blood, ED     Status: None   Collection Time: 06/10/18  4:43 PM  Result Value Ref Range   I-stat hCG, quantitative <5.0 <5 mIU/mL   Comment 3            Comment:   GEST. AGE      CONC.  (mIU/mL)   <=1 WEEK        5 - 50     2 WEEKS       50 - 500     3 WEEKS       100 - 10,000     4 WEEKS     1,000 - 30,000        FEMALE AND NON-PREGNANT FEMALE:     LESS THAN 5  mIU/mL   Acetaminophen level     Status: Abnormal   Collection Time: 06/10/18  7:36 PM  Result Value Ref Range   Acetaminophen (Tylenol), Serum <10 (L) 10 - 30 ug/mL    Comment: (NOTE) Therapeutic concentrations vary significantly. A range of 10-30 ug/mL  may be an effective concentration for many patients. However, some  are best treated at concentrations outside of this range. Acetaminophen concentrations >150 ug/mL at 4 hours after ingestion  and >50 ug/mL at 12 hours after ingestion are often associated with  toxic reactions. Performed at [facility], [address] [place], [place] [address]    No results found. None  Pending Labs Unresulted Labs (From admission, onward)   None      Vitals/Pain Today's Vitals   06/11/18 0606 06/11/18 0732 06/11/18 1031 06/11/18 1227  BP: 105/62   104/86  Pulse: 85   75  Resp: 18   18  Temp: 98.2 F (36.8 C)   98.2 F (36.8 C)  TempSrc: Oral   Oral  SpO2: 100%   99%  PainSc:  0-No pain 0-No pain     Isolation Precautions No active isolations  Medications Medications  metFORMIN (GLUCOPHAGE) tablet 1,000 mg (has no administration in time range)  ALPRAZolam Prudy Feeler) tablet 0.5 mg (0.5 mg Oral Given 06/11/18 1110)    Mobility walks

## 2018-06-11 NOTE — ED Notes (Signed)
Pt transferring to [facility] after 1300 today

## 2018-06-11 NOTE — ED Notes (Signed)
Asking to use the phone this am, told the phone is available at 10. She asked if she could use the phone at 9a since she has a phone interview at 69. Gave her permission to use phone at 9 a and will pass the information along to the next shift as Clinical research associate wont be here at [employer].

## 2018-06-11 NOTE — BHH Suicide Risk Assessment (Signed)
[facility] Admission Suicide Risk Assessment   Nursing information obtained from:   Patient and chart Demographic factors:   68, married, 2 children, employed Current Mental Status:   See below Loss Factors:   Work-related stressors Historical Factors:   Prior history of depression, no prior psychiatric admissions, no prior suicidal attempts Risk Reduction Factors:   Resilience  Total Time spent with patient: 45 minutes Principal Problem: MDD, status post benzodiazepine overdose  Diagnosis:  Active Problems:   MDD (major depressive disorder), recurrent severe, without psychosis (HCC)  Subjective Data:   Continued Clinical Symptoms:    The "Alcohol Use Disorders Identification Test", Guidelines for Use in Primary Care, Second Edition.  World Science writer [facility]). Score between 0-7:  no or low risk or alcohol related problems. Score between 8-15:  moderate risk of alcohol related problems. Score between 16-19:  high risk of alcohol related problems. Score 20 or above:  warrants further diagnostic evaluation for alcohol dependence and treatment.   CLINICAL FACTORS:  33 year old married female, presented to hospital via EMS following an overdose on Ativan which occurred at work.  Patient states overdose was unplanned, impulsive and related to frustration at work.  States she feels that in spite of being promised training and promotion she has been assigned menial tasks while others are given more responsibility.  She does endorse depression and acknowledges some neurovegetative symptoms prior to admission but denies any suicidal ideations and states that overdose was not suicidal in intent.  She reports history of prior depression 3 years ago stemming from marital tension at the time, has been on Effexor XR and Ativan PRNs (states she takes irregularly, denies abuse) since then.   Psychiatric Specialty Exam: Physical Exam  ROS  Blood pressure 129/79, pulse (!) 117, temperature 98.4 F  (36.9 C), temperature source Oral, resp. rate 18, height 5\' 4"  (1.626 m), weight 69.9 kg, last menstrual period 06/06/2018, SpO2 99 %.Body mass index is 26.43 kg/m.  See admit note MSE   COGNITIVE FEATURES THAT CONTRIBUTE TO RISK:  Closed-mindedness and Loss of executive function    SUICIDE RISK:   Moderate:  Frequent suicidal ideation with limited intensity, and duration, some specificity in terms of plans, no associated intent, good self-control, limited dysphoria/symptomatology, some risk factors present, and identifiable protective factors, including available and accessible social support.  PLAN OF CARE: Patient will be admitted to inpatient psychiatric unit for stabilization and safety. Will provide and encourage milieu participation. Provide medication management and maked adjustments as needed.  Will follow daily.    I certify that inpatient services furnished can reasonably be expected to improve the patient's condition.   Craige Cotta, MD 06/11/2018, 4:37 PM

## 2018-06-11 NOTE — ED Notes (Signed)
Report called to [facility] called for transport

## 2018-06-11 NOTE — Progress Notes (Signed)
D: Pt denies SI/HI/AVH. Pt is pleasant and cooperative. Pt presented as if she was minimizing what happened. Pt was allowed to get some numbers off her phone. Pt has been visible on the unit   A: Pt was offered support and encouragement. Pt was given scheduled medications. Pt was encourage to attend groups. Q 15 minute checks were done for safety.   R:Pt attends groups and interacts well with peers and staff. Pt is taking medication. Pt has no complaints.Pt receptive to treatment and safety maintained on unit.   Problem: Coping: Goal: Coping ability will improve Outcome: Progressing   Problem: Coping: Goal: Will verbalize feelings Outcome: Progressing

## 2018-06-11 NOTE — Progress Notes (Signed)
AC Kim  Accepted pending discharges. AC Day Shift will coordinate bed.  Call report to [phone].  AC will follow up.

## 2018-06-11 NOTE — BHH Group Notes (Signed)
Adult Psychoeducational Group Note  Date:  06/11/2018 Time:  11:44 PM  Group Topic/Focus:  Wrap-Up Group:   The focus of this group is to help patients review their daily goal of treatment and discuss progress on daily workbooks.  Participation Level:  Active  Participation Quality:  Appropriate and Attentive  Affect:  Appropriate  Cognitive:  Alert and Appropriate  Insight: Appropriate and Good  Engagement in Group:  Engaged  Modes of Intervention:  Discussion and Education  Additional Comments:  Pt attended and participated in wrap up group this evening. Pt rated their day an 8/10, do to them getting use to everything at [facility] and seeing their kids. Pt does not have a goal while they are here because they "dont need to be here".   Chrisandra Netters 06/11/2018, 11:44 PM

## 2018-06-12 LAB — RETICULOCYTES
Immature Retic Fract: 13.1 % (ref 2.3–15.9)
RBC.: 5.43 MIL/uL — ABNORMAL HIGH (ref 3.87–5.11)
Retic Count, Absolute: 59.2 10*3/uL (ref 19.0–186.0)
Retic Ct Pct: 1.1 % (ref 0.4–3.1)

## 2018-06-12 LAB — IRON AND TIBC
Iron: 15 ug/dL — ABNORMAL LOW (ref 28–170)
SATURATION RATIOS: 3 % — AB (ref 10.4–31.8)
TIBC: 449 ug/dL (ref 250–450)
UIBC: 434 ug/dL

## 2018-06-12 LAB — VITAMIN B12: Vitamin B-12: 219 pg/mL (ref 180–914)

## 2018-06-12 LAB — FOLATE: Folate: 10.2 ng/mL (ref >5.9)

## 2018-06-12 LAB — TSH: TSH: 2.386 u[IU]/mL (ref 0.350–4.500)

## 2018-06-12 LAB — FERRITIN: Ferritin: 4 ng/mL — ABNORMAL LOW (ref 11–307)

## 2018-06-12 NOTE — BHH Group Notes (Signed)
Adult Psychoeducational Group Note  Date:  06/12/2018 Time:  10:30 PM  Group Topic/Focus:  Wrap-Up Group:   The focus of this group is to help patients review their daily goal of treatment and discuss progress on daily workbooks.  Participation Level:  Active  Participation Quality:  Appropriate and Attentive  Affect:  Appropriate  Cognitive:  Alert and Appropriate  Insight: Appropriate and Good  Engagement in Group:  Engaged  Modes of Intervention:  Discussion and Education  Additional Comments:  Pt attended and participated in wrap up group this evening. Pt rated their day a 9/10, due to them talking to their kids and assured them that they were leaving tomorrow. Pt completed their goal, which was to work on a discharge plan.   Chrisandra Netters 06/12/2018, 10:30 PM

## 2018-06-12 NOTE — Progress Notes (Addendum)
[facility] MD Progress Note  06/12/2018 1:09 PM Kelsey Peters  MRN:  [id number] Subjective:  "I'm doing great but sleepy."  Ms. Kelsey Peters found socializing in dayroom. Reports some sleepiness today but states she believes this is related to finally sleeping last night for the first time in several nights. Reports overdose was due to high stress at her job recently related to mistreatment from her Production designer, theatre/television/film and coworkers. Minimizes her overdose (15 of 0.5 mg Ativan), saying, "I was just trying to relax. I didn't want to die. I don't need to be here." She is unable to say what she will do differently next time if feeling stressed, only "this will never happen again." She denies current or recent history of depression. Denies SI. States she plans to return to this job after discharge to finish her one month notice since she has already resigned, if they will allow her to return (overdose took place at work). She is looking for other jobs. States Effexor has been effective for managing her mood for years, and she would like to maintain Effexor at current dose. Support and encouragement provided.  From admission H&P: 33 year old married female, presented to ED on 1/7 via EMS ( apparently contacted by a coworker) following an overdose on prescribed Ativan. Patient states she is unsure how many she took, states " whatever was left in the bottle". She is prescribed Ativan 0.5 mgrs BID which she states she takes irregularly and denies any pattern of abuse or misuse. Overdose was impulsive and unplanned. States " I just got very frustrated at work". Does endorse depression and some neuro-vegetative symptoms as below. Reports she does not feel overdose was suicidal in intent, but rather " just to calm down ". Denies prior suicidal ideations. Denies psychotic symptoms. States she feels tension at work , and feels that after initially being promised training and promotions she has been tasked with menial tasks, which she feels is related  to her being from a different culture, due to which she had recently resigned. Other stressors include her father being medically ill and currently hospitalized in [place] Principal Problem: MDD (major depressive disorder), recurrent severe, without psychosis (HCC) Diagnosis: Principal Problem:   MDD (major depressive disorder), recurrent severe, without psychosis (HCC)  Total Time spent with patient: 20 minutes  Past Psychiatric History: See admission H&P  Past Medical History: History reviewed. No pertinent past medical history. History reviewed. No pertinent surgical history. Family History: History reviewed. No pertinent family history. Family Psychiatric  History: See admission H&P Social History:  Social History   Substance and Sexual Activity  Alcohol Use Not on file     Social History   Substance and Sexual Activity  Drug Use Not on file    Social History   Socioeconomic History  . Marital status: Married    Spouse name: Not on file  . Number of children: Not on file  . Years of education: Not on file  . Highest education level: Not on file  Occupational History  . Not on file  Social Needs  . Financial resource strain: Not on file  . Food insecurity:    Worry: Not on file    Inability: Not on file  . Transportation needs:    Medical: Not on file    Non-medical: Not on file  Tobacco Use  . Smoking status: Never Smoker  . Smokeless tobacco: Never Used  Substance and Sexual Activity  . Alcohol use: Not on file  . Drug  use: Not on file  . Sexual activity: Not on file  Lifestyle  . Physical activity:    Days per week: Not on file    Minutes per session: Not on file  . Stress: Not on file  Relationships  . Social connections:    Talks on phone: Not on file    Gets together: Not on file    Attends religious service: Not on file    Active member of club or organization: Not on file    Attends meetings of clubs or organizations: Not on file    Relationship  status: Not on file  Other Topics Concern  . Not on file  Social History Narrative  . Not on file   Additional Social History:                         Sleep: Good  Appetite:  Good  Current Medications: Current Facility-Administered Medications  Medication Dose Route Frequency Provider Last Rate Last Dose  . acetaminophen (TYLENOL) tablet 650 mg  650 mg Oral Q6H PRN Laveda AbbeParks, Laurie Britton, NP      . alum & mag hydroxide-simeth (MAALOX/MYLANTA) 200-200-20 MG/5ML suspension 30 mL  30 mL Oral Q4H PRN Laveda AbbeParks, Laurie Britton, NP      . Influenza vac split quadrivalent PF (FLUARIX) injection 0.5 mL  0.5 mL Intramuscular Tomorrow-1000 Cobos, Fernando A, MD      . LORazepam (ATIVAN) tablet 0.5 mg  0.5 mg Oral Q8H PRN Cobos, Rockey SituFernando A, MD   0.5 mg at 06/11/18 2145  . magnesium hydroxide (MILK OF MAGNESIA) suspension 30 mL  30 mL Oral Daily PRN Laveda AbbeParks, Laurie Britton, NP      . metFORMIN (GLUCOPHAGE) tablet 1,000 mg  1,000 mg Oral BID WC Laveda AbbeParks, Laurie Britton, NP   1,000 mg at 06/12/18 0746  . traZODone (DESYREL) tablet 50 mg  50 mg Oral QHS PRN Laveda AbbeParks, Laurie Britton, NP   50 mg at 06/11/18 2144  . venlafaxine XR (EFFEXOR-XR) 24 hr capsule 150 mg  150 mg Oral Q breakfast Cobos, Rockey SituFernando A, MD   150 mg at 06/12/18 [id number]    Lab Results:  Results for orders placed or performed during the hospital encounter of 06/11/18 (from the past 48 hour(s))  Vitamin B12     Status: None   Collection Time: 06/12/18  6:28 AM  Result Value Ref Range   Vitamin B-12 219 180 - 914 pg/mL    Comment: (NOTE) This assay is not validated for testing neonatal or myeloproliferative syndrome specimens for Vitamin B12 levels. Performed at [facility], [address] [place], [place] [address]   Folate     Status: None   Collection Time: 06/12/18  6:28 AM  Result Value Ref Range   Folate 10.2 >5.9 ng/mL    Comment: Performed at [facility], [address]  [place], [place] [address]  Iron and TIBC     Status: Abnormal   Collection Time: 06/12/18  6:28 AM  Result Value Ref Range   Iron 15 (L) 28 - 170 ug/dL   TIBC 621449 308250 - 657450 ug/dL   Saturation Ratios 3 (L) 10.4 - 31.8 %   UIBC 434 ug/dL    Comment: Performed at [facility], [address] [place], [place] [address]  Ferritin     Status: Abnormal   Collection Time: 06/12/18  6:28 AM  Result Value Ref Range   Ferritin 4 (  L) 11 - 307 ng/mL    Comment: Performed at [facility], [address], [place], [place] [address]  Reticulocytes     Status: Abnormal   Collection Time: 06/12/18  6:28 AM  Result Value Ref Range   Retic Ct Pct 1.1 0.4 - 3.1 %   RBC. 5.43 (H) 3.87 - 5.11 MIL/uL   Retic Count, Absolute 59.2 19.0 - 186.0 K/uL   Immature Retic Fract 13.1 2.3 - 15.9 %    Comment: Performed at [facility], [address], [place], [place] [address]  TSH     Status: None   Collection Time: 06/12/18  6:28 AM  Result Value Ref Range   TSH 2.386 0.350 - 4.500 uIU/mL    Comment: Performed by a 3rd Generation assay with a functional sensitivity of <=0.01 uIU/mL. Performed at [facility], [address], [place], [place] [address]     Blood Alcohol level:  Lab Results  Component Value Date   ETH <10 06/10/2018    Metabolic Disorder Labs: No results found for: HGBA1C, MPG No results found for: PROLACTIN No results found for: CHOL, TRIG, HDL, CHOLHDL, VLDL, LDLCALC  Physical Findings: AIMS: Facial and Oral Movements Muscles of Facial Expression: None, normal Lips and Perioral Area: None, normal Jaw: None, normal Tongue: None, normal,Extremity Movements Upper (arms, wrists, hands, fingers): None, normal Lower (legs, knees, ankles, toes): None, normal, Trunk Movements Neck, shoulders, hips: None, normal, Overall Severity Severity of abnormal movements (highest score from questions above): None,  normal Incapacitation due to abnormal movements: None, normal Patient's awareness of abnormal movements (rate only patient's report): No Awareness, Dental Status Current problems with teeth and/or dentures?: No Does patient usually wear dentures?: No  CIWA:    COWS:     Musculoskeletal: Strength & Muscle Tone: within normal limits Gait & Station: normal Patient leans: N/A  Psychiatric Specialty Exam: Physical Exam  Nursing note and vitals reviewed. Constitutional: She is oriented to person, place, and time. She appears well-developed and well-nourished.  Cardiovascular: Normal rate.  Respiratory: Effort normal.  Neurological: She is alert and oriented to person, place, and time.    Review of Systems  Constitutional: Negative.   Respiratory: Negative.   Cardiovascular: Negative.   Psychiatric/Behavioral: Negative for depression, hallucinations, memory loss, substance abuse and suicidal ideas. The patient is not nervous/anxious and does not have insomnia.     Blood pressure 106/79, pulse 98, temperature 98.8 F (37.1 C), temperature source Oral, resp. rate 16, height 5\' 4"  (1.626 m), weight 69.9 kg, last menstrual period 06/06/2018, SpO2 99 %.Body mass index is 26.43 kg/m.  General Appearance: Casual  Eye Contact:  Good  Speech:  Clear and Coherent  Volume:  Normal  Mood:  Euthymic  Affect:  Congruent  Thought Process:  Coherent  Orientation:  Full (Time, Place, and Person)  Thought Content:  WDL  Suicidal Thoughts:  No  Homicidal Thoughts:  No  Memory:  Immediate;   Good  Judgement:  Impaired  Insight:  Limited  Psychomotor Activity:  Normal  Concentration:  Concentration: Good  Recall:  Good  Fund of Knowledge:  Good  Language:  Good  Akathisia:  No  Handed:  Right  AIMS (if indicated):     Assets:  Communication Skills Desire for Improvement Housing Social Support  ADL's:  Intact  Cognition:  WNL  Sleep:  Number of Hours: 6.25     Treatment Plan  Summary: Daily contact with patient to assess  and evaluate symptoms and progress in treatment and Medication management  Continue inpatient hospitalization.  MDD: Continue Effexor XR 150 mg PO daily  Anxiety: Continue Ativan 0.5 mg PO Q8HR PRN anxiety  Sleep: Continue trazodone 50 mg PO QHS PRN insomnia  PCOS: Continue metformin 1000 mg PO BID  Patient will participate in the therapeutic group milieu.  Discharge disposition in progress.   Aldean BakerJanet E Sykes, NP 06/12/2018, 1:09 PM    .Agree with NP Progress Note

## 2018-06-12 NOTE — BHH Group Notes (Signed)
LCSW Group Therapy Note 06/12/2018 2:21 PM  Type of Therapy/Topic: Group Therapy: Feelings about Diagnosis  Participation Level: Active   Description of Group:  This group will allow patients to explore their thoughts and feelings about diagnoses they have received. Patients will be guided to explore their level of understanding and acceptance of these diagnoses. Facilitator will encourage patients to process their thoughts and feelings about the reactions of others to their diagnosis and will guide patients in identifying ways to discuss their diagnosis with significant others in their lives. This group will be process-oriented, with patients participating in exploration of their own experiences, giving and receiving support, and processing challenge from other group members.  Therapeutic Goals: 1. Patient will demonstrate understanding of diagnosis as evidenced by identifying two or more symptoms of the disorder 2. Patient will be able to express two feelings regarding the diagnosis 3. Patient will demonstrate their ability to communicate their needs through discussion and/or role play  Summary of Patient Progress:  Kelsey Peters was engaged and participated throughout the group session. [name] reports that she feels weak for having a mental health diagnosis. She reports that her immediate family is very supportive, however her culture does not accept mental health and shames individuals who express that they have those issues.     Therapeutic Modalities:  Cognitive Behavioral Therapy Brief Therapy Feelings Identification    Deema Juncaj Catalina AntiguaWilliams LCSWA Clinical Social Worker

## 2018-06-12 NOTE — Progress Notes (Signed)
D: Pt was at the nurse's station upon initial approach.  Pt presents with appropriate affect and mood.  She reports her day was "great" and her goal is to "hopefully go tomorrow to see my kids."  Pt reports she had a "great" visit with her husband tonight.  Pt is seen smiling on occasion.  Pt denies SI/HI, denies hallucinations, denies pain.  Pt has been visible in milieu interacting with peers and staff appropriately.  Pt attended evening group.    A: Introduced self to pt.  Met with pt 1:1.  Actively listened to pt and offered support and encouragement.  PRN medication administered for anxiety and sleep.  Q15 minute safety checks maintained.  R: Pt is safe on the unit.  Pt is compliant with medications.  Pt verbally contracts for safety.  Will continue to monitor and assess.

## 2018-06-12 NOTE — Progress Notes (Signed)
Patient ID: Kelsey Peters, female   DOB: Apr 28, 1986, 33 y.o.   MRN: [id number]  Nursing Progress Note [id number]  Data: On initial approach, patient appears with flat affect and depressed mood. Patient is seen up in the milieu. Patient compliant with scheduled medications. Patient denies pain/physical complaints. Patient completed self-inventory sheet and rates depression, hopelessness, and anxiety 0,0,0 respectively. Patient rates their sleep and appetite as good/good respectively. Patient states goal for today is to "work on my prayers, spend time on my group activities". Patient is seen attending groups and visible in the milieu. Patient currently denies SI/HI/AVH.   Action: Patient is educated about and provided medication per provider's orders. Patient safety maintained with q15 min safety checks and frequent rounding. Low fall risk precautions in place. Emotional support given. 1:1 interaction and active listening provided. Patient encouraged to attend meals, groups, and work on treatment plan and goals. Labs, vital signs and patient behavior monitored throughout shift.   Response: Patient remains safe on the unit at this time and agrees to come to staff with any issues/concerns. Patient is interacting with peers appropriately on the unit. Will continue to support and monitor.

## 2018-06-12 NOTE — BHH Counselor (Signed)
Adult Comprehensive Assessment  Patient ID: Kelsey Peters, female   DOB: 1985-06-14, 33 y.o.   MRN: [id number]  Information Source: Information source: Patient  Current Stressors:  Patient states their primary concerns and needs for treatment are:: "My work, I was tired of being discriminated against"  Educational / Learning stressors: N/A  Employment / Job issues: Patient reports that she recently resigned from her job due to being discriminated against; She reports that her supervisors were extremely condescending  Family Relationships: Patient expressed concern about her family's opinion about her suicidal ideation Surveyor, quantity / Lack of resources (include bankruptcy): Patient reports that she is concerned about resigning from her job due to her job making the most income for her household Housing / Lack of housing: Patient reports living with her husband and two children; Denies any stressors  Physical health (include injuries & life threatening diseases): Patient denies any stressors  Social relationships: Patient reports having a strained relationship with her co-workers  Substance abuse: Patient denies any substance use  Bereavement / Loss: Patient denies any stressors   Living/Environment/Situation:  Living Arrangements: Spouse/significant other, Children Living conditions (as described by patient or guardian): "good" Who else lives in the home?: Spouse, two children  How long has patient lived in current situation?: 12 years  What is atmosphere in current home: Comfortable, Supportive, Loving  Family History:  Marital status: Married Number of Years Married: 14 What types of issues is patient dealing with in the relationship?: Patient denies any marital issues  Additional relationship information: No  Are you sexually active?: Yes What is your sexual orientation?: Heterosexual  Has your sexual activity been affected by drugs, alcohol, medication, or emotional stress?: No  Does  patient have children?: Yes How many children?: 2 How is patient's relationship with their children?: Patient reports being close with her 33yo and 33yo.   Childhood History:  By whom was/is the patient raised?: Both parents Description of patient's relationship with caregiver when they were a child: Patient reports having a good relationship with her parents during her childhood  Patient's description of current relationship with people who raised him/her: Patient states that she has a good relationship with her parents, however they have grown distant due to physical distance and motherhood.  How were you disciplined when you got in trouble as a child/adolescent?: "My mother would just give [place] this look"  Does patient have siblings?: Yes Number of Siblings: 3 Description of patient's current relationship with siblings: Patient reports having a loving and close relationship with her three siblings.  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?: No Are There Guns or Other Weapons in Your Home?: No  Financial Resources:   Financial resources: No income, Income from spouse,  Private insurance Does patient have a representative payee or guardian?: No  Alcohol/Substance Abuse:   What has been your use of drugs/alcohol within the last 12 months?: Patient denies  If attempted suicide, did drugs/alcohol play a role in this?: No Alcohol/Substance Abuse Treatment Hx: Denies past history Has alcohol/substance abuse ever caused legal problems?: No  Social Support System:   Patient's Community Support System: Good Describe Community Support System: Family  Type of faith/religion: Islam How does patient's faith help to cope with current illness?: Prayer   Leisure/Recreation:   Leisure and Hobbies: "Watching TV and reading"   Strengths/Needs:   What is the patient's perception of their strengths?: "Positive, Determined and I'm a people person"  Patient states they can use these personal strengths during their treatment to contribute to their recovery: Yes  Patient states these barriers may affect/interfere with their treatment: No  Patient states these barriers may affect their return to the community: No  Other important information patient would like considered in planning for their treatment: No   Discharge Plan:   Currently receiving community mental health services: No Patient states concerns and preferences for aftercare planning are: Patient would like to be referred to an outpatient provider for medication management and therapy services.  Patient states they will know when they are safe and ready for discharge when: Yes, patient reports she would like to learn more coping skills  Does patient have access to transportation?: Yes Does patient have financial barriers related to discharge medications?: No Will patient be returning to same living situation after discharge?: Yes  Summary/Recommendations:   Summary and Recommendations (to be completed by the evaluator): Charise Killianayab is a 33 year old female who is diagnosed with MDD, recurrent severe. She presented to  the hospital seeking treatment for taking an intentional overdose on Ativan. During the assessment, Denisse was pleasant and cooperative with providing information for the assessment. Estel reports that she felt overwhelmed and tired of being discriminated against at her workplace. Jadi reports that she made an impulsive decision and that she is aware that she made a major mistake. Amillion reports that she would like to be referred to an outpatient provider for medication management and therapy services. Leocadia can benefit from crisis stabilization, medication management, therapeutic milieu and referral services.   Maeola SarahJolan E Natale Thoma. 06/12/2018

## 2018-06-13 MED ORDER — TRAZODONE HCL 50 MG PO TABS: 50.0000 mg | ORAL_TABLET | Freq: Every evening | ORAL | 0 refills | Status: AC | PRN

## 2018-06-13 MED ORDER — FERROUS SULFATE 325 (65 FE) MG PO TABS: 325.0000 mg | ORAL_TABLET | Freq: Every day | ORAL | 0 refills | Status: DC

## 2018-06-13 MED ORDER — FERROUS SULFATE 325 (65 FE) MG PO TABS: 325.0000 mg | ORAL_TABLET | Freq: Every day | ORAL | 0 refills | Status: AC

## 2018-06-13 MED ORDER — LORAZEPAM 0.5 MG PO TABS: 0.2500 mg | ORAL_TABLET | Freq: Two times a day (BID) | ORAL | 0 refills | Status: AC | PRN

## 2018-06-13 MED ORDER — VENLAFAXINE HCL ER 150 MG PO CP24: 150.0000 mg | ORAL_CAPSULE | Freq: Every day | ORAL | 0 refills | Status: AC

## 2018-06-13 MED ORDER — FERROUS SULFATE 325 (65 FE) MG PO TABS
325.0000 mg | ORAL_TABLET | Freq: Every day | ORAL | Status: DC
  Filled 2018-06-13 (×2): qty 1

## 2018-06-13 NOTE — Progress Notes (Signed)
Patient ID: Kelsey Peters, female   DOB: December 27, 1985, 34 y.o.   MRN: [id number]  Nursing Progress Note [id number]  Data: On initial approach, patient is seen resting in bed. Patient presents calm, pleasant and cooperative. Patient compliant with scheduled medications. Patient denies pain/physical complaints. Patient completed self-inventory sheet and rates depression, hopelessness, and anxiety 0,0,0 respectively. Patient rates their sleep and appetite as good/good respectively. Patient states goal for today is to "work on discharge plan so I can see my kids". Patient is seen visible in the milieu. Patient currently denies SI/HI/AVH.   Action: Patient is educated about and provided medication per provider's orders. Patient safety maintained with q15 min safety checks and frequent rounding. Low fall risk precautions in place. Emotional support given. 1:1 interaction and active listening provided. Patient encouraged to attend meals, groups, and work on treatment plan and goals. Labs, vital signs and patient behavior monitored throughout shift.   Response: Patient remains safe on the unit at this time and agrees to come to staff with any issues/concerns. Patient is interacting with peers appropriately on the unit. Will continue to support and monitor.

## 2018-06-13 NOTE — Progress Notes (Signed)
  [facility] Adult Case Management Discharge Plan :  Will you be returning to the same living situation after discharge:  Yes,  patient reports she is returning home with her family  At discharge, do you have transportation home?: Yes,  patient reports her husband is picking her up at discharge Do you have the ability to pay for your medications: Yes,  [employer], income from employment, spouse support  Release of information consent forms completed and in the chart;  Patient's signature needed at discharge.  Patient to Follow up at: Follow-up Information    [facility] on 06/24/2018.   Why:  Your therapy appointment with Leta Jungling is Tuesday, 1/21 at 9:30a.  Your medication management appointment with Elon Jester is Wednesday, 1/22 at 9:30a. Please call within 24 hours after discharge to hold appointment and pay the $20 deposit.  Contact information: [address] [place] [address] [phone]           Next level of care provider has access to [facility] Link:yes  Safety Planning and Suicide Prevention discussed: Yes,  with the patient's husband  Have you used any form of tobacco in the last 30 days? (Cigarettes, Smokeless Tobacco, Cigars, and/or Pipes): No  Has patient been referred to the Quitline?: N/A patient is not a smoker  Patient has been referred for addiction treatment: N/A  Maeola Sarah, LCSWA 06/13/2018, 10:18 AM

## 2018-06-13 NOTE — Discharge Summary (Addendum)
Physician Discharge Summary Note  Patient:  Kelsey Peters is an 33 y.o., female MRN:  [id number] DOB:  05/20/1986 Patient phone:  [phone] (home)  Patient address:   [address] [place] [address],  Total Time spent with patient: 15 minutes  Date of Admission:  06/11/2018 Date of Discharge: 06/13/2018  Reason for Admission:  Overdose on Ativan  Principal Problem: MDD (major depressive disorder), recurrent severe, without psychosis (HCC) Discharge Diagnoses: Principal Problem:   MDD (major depressive disorder), recurrent severe, without psychosis (HCC)   Past Psychiatric History: Per admission H&P: no prior psychiatric admissions. No history of suicide attempts. Denies history of self cutting . Denies history of psychosis. No history of mania or hypomania, no history of  PTSD  States she was started on psychiatric medications by her PCP following a period of marital tension which caused depression, anxiety about three years ago, and has been on these medications ( Effexor, Ativan) for 2-3 years. Denies history of violence    Past Medical History: History reviewed. No pertinent past medical history. History reviewed. No pertinent surgical history. Family History: History reviewed. No pertinent family history. Family Psychiatric  History: Per admission H&P: Denies history of mental illness in family. No suicides in family. No alcohol use disorder in family  Social History:  Social History   Substance and Sexual Activity  Alcohol Use Not on file     Social History   Substance and Sexual Activity  Drug Use Not on file    Social History   Socioeconomic History  . Marital status: Married    Spouse name: Not on file  . Number of children: Not on file  . Years of education: Not on file  . Highest education level: Not on file  Occupational History  . Not on file  Social Needs  . Financial resource strain: Not on file  . Food insecurity:    Worry: Not on file    Inability:  Not on file  . Transportation needs:    Medical: Not on file    Non-medical: Not on file  Tobacco Use  . Smoking status: Never Smoker  . Smokeless tobacco: Never Used  Substance and Sexual Activity  . Alcohol use: Not on file  . Drug use: Not on file  . Sexual activity: Not on file  Lifestyle  . Physical activity:    Days per week: Not on file    Minutes per session: Not on file  . Stress: Not on file  Relationships  . Social connections:    Talks on phone: Not on file    Gets together: Not on file    Attends religious service: Not on file    Active member of club or organization: Not on file    Attends meetings of clubs or organizations: Not on file    Relationship status: Not on file  Other Topics Concern  . Not on file  Social History Narrative  . Not on file    Hospital Course:  Per admission H&P 06/11/2018: 33 year old married female, presented to ED on 1/7 via EMS ( apparently contacted by a coworker) following an overdose on prescribed Ativan. Patient states she is unsure how many she took, states " whatever was left in the bottle". She is prescribed Ativan 0.5 mgrs BID which she states she takes irregularly and denies any pattern of abuse or misuse. Overdose was impulsive and unplanned. States " I just got very frustrated at work". Does endorse depression  and some neuro-vegetative symptoms as below. Reports she does not feel overdose was suicidal in intent, but rather " just to calm down ". Denies prior suicidal ideations. Denies psychotic symptoms. States she feels tension at work, and feels that after initially being promised training and promotions she has been tasked with menial tasks, which she feels is related to her being from a different culture, due to which she had recently resigned. Other stressors include her father being medically ill and currently hospitalized in [place]  Kelsey Peters was hospitalized after an overdose on ~15 tabs of 0.5 mg Ativan. 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 She denied suicidal intent behind overdose, states she was "just trying to relax." Denied SI throughout hospitalization. She remained on the [facility] unit for 3 days. Effexor and Ativan were continued. Iron was started for iron deficiency anemia (06/12/18 iron 15, hemoglobin 11.4). She stabilized with medication and therapy. She was discharged on medications listed below. She has shown improvement with improved mood, affect, sleep, appetite, and interaction. She denies any SI/HI/AVH and contracts for safety. She agrees to follow up at the [facility]. She is provided with prescriptions for medications upon discharge.  Physical Findings: AIMS: Facial and Oral Movements Muscles of Facial Expression: None, normal Lips and Perioral Area: None, normal Jaw: None, normal Tongue: None, normal,Extremity Movements Upper (arms, wrists, hands, fingers): None, normal Lower (legs, knees, ankles, toes): None, normal, Trunk Movements Neck, shoulders, hips: None, normal, Overall Severity Severity of abnormal movements (highest score from questions above): None, normal Incapacitation due to abnormal movements: None, normal Patient's awareness of abnormal movements (rate only patient's report): No Awareness, Dental Status Current problems with teeth and/or dentures?: No Does patient usually wear dentures?: No  CIWA:    COWS:     Musculoskeletal: Strength & Muscle Tone: within normal limits Gait & Station: normal Patient leans: N/A  Psychiatric Specialty Exam: Physical Exam  Nursing note and vitals reviewed. Constitutional: She is oriented to person, place, and time. She appears well-developed and well-nourished.  Cardiovascular: Normal rate.  Respiratory: Effort normal.  Neurological: She is alert and oriented to person, place, and time.    Review of Systems  Constitutional: Negative.   Respiratory: Negative.    Cardiovascular: Negative.   Neurological: Negative.   Psychiatric/Behavioral: Positive for depression (stable on medication). Negative for hallucinations, memory loss, substance abuse and suicidal ideas. The patient is not nervous/anxious and does not have insomnia.     Blood pressure 116/76, pulse (!) 111, temperature 98.8 F (37.1 C), temperature source Oral, resp. rate 16, height 5\' 4"  (1.626 m), weight 69.9 kg, last menstrual period 06/06/2018, SpO2 99 %.Body mass index is 26.43 kg/m.  See MD's discharge SRA     Have you used any form of tobacco in the last 30 days? (Cigarettes, Smokeless Tobacco, Cigars, and/or Pipes): No  Has this patient used any form of tobacco in the last 30 days? (Cigarettes, Smokeless Tobacco, Cigars, and/or Pipes)  No  Blood Alcohol level:  Lab Results  Component Value Date   ETH <10 06/10/2018    Metabolic Disorder Labs:  No results found for: HGBA1C, MPG No results found for: PROLACTIN No results found for: CHOL, TRIG, HDL, CHOLHDL, VLDL, LDLCALC  See Psychiatric Specialty Exam and Suicide Risk Assessment completed by Attending Physician prior to discharge.  Discharge destination:  Home  Is patient on multiple antipsychotic therapies at discharge:  No   Has  Patient had three or more failed trials of antipsychotic monotherapy by history:  No  Recommended Plan for Multiple Antipsychotic Therapies: NA  Discharge Instructions    Discharge instructions   Complete by:  As directed    Patient is instructed to take all prescribed medications as recommended. Report any side effects or adverse reactions to your outpatient psychiatrist. Patient is instructed to abstain from alcohol and illegal drugs while on prescription medications. In the event of worsening symptoms, patient is instructed to call the crisis hotline, 911, or go to the nearest emergency department for evaluation and treatment.     Allergies as of 06/13/2018   Not on File      Medication List    TAKE these medications     Indication  ferrous sulfate 325 (65 FE) MG tablet Take 1 tablet (325 mg total) by mouth daily with breakfast. (May buy over the counter) Start taking on:  June 14, 2018  Indication:  Iron Deficiency   LORazepam 0.5 MG tablet Commonly known as:  ATIVAN Take 0.5-1 tablets (0.25-0.5 mg total) by mouth 2 (two) times daily as needed for anxiety.  Indication:  Feeling Anxious   metFORMIN 1000 MG tablet Commonly known as:  GLUCOPHAGE Take 1,000 mg by mouth 2 (two) times daily with a meal.  Indication:  PCOS   traZODone 50 MG tablet Commonly known as:  DESYREL Take 1 tablet (50 mg total) by mouth at bedtime as needed for sleep.  Indication:  Trouble Sleeping   venlafaxine XR 150 MG 24 hr capsule Commonly known as:  EFFEXOR-XR Take 1 capsule (150 mg total) by mouth daily with breakfast. For mood Start taking on:  June 14, 2018  Indication:  Mood      Follow-up Information    [facility] on 06[facility]24/2018.   Why:  Your therapy appointment with Leta JunglingJake is Tuesday, 1/21 at 9:30a.  Your medication management appointment with Elon JesterMichele is Wednesday, 1/22 at 9:30a. Please call within 24 hours after discharge to hold appointment and pay the $20 deposit.  Contact information: [address] [place] [address] [phone]           Follow-up recommendations: Activity as tolerated. Diet as recommended by primary care physician. Keep all scheduled follow-up appointments as recommended.   Comments:   Patient is instructed to take all prescribed medications as recommended. Report any side effects or adverse reactions to your outpatient psychiatrist. Patient is instructed to abstain from alcohol and illegal drugs while on prescription medications. In the event of worsening symptoms, patient is instructed to call the crisis hotline, 911, or go to the nearest emergency department for evaluation and  treatment.  Signed: Aldean BakerJanet E Sykes, NP 06/13/2018, 3:12 PM   Patient seen, Suicide Assessment Completed.  Disposition Plan Reviewed

## 2018-06-13 NOTE — [facility] Suicide Risk Assessment (Signed)
[facility] INPATIENT:  Family/Significant Other Suicide Prevention Education  Suicide Prevention Education:  Education Completed; Lyn Komm, husband [phone])  has been identified by the patient as the family member/significant other with whom the patient will be residing, and identified as the person(s) who will aid the patient in the event of a mental health crisis (suicidal ideations/suicide attempt).  With written consent from the patient, the family member/significant other has been provided the following suicide prevention education, prior to the and/or following the discharge of the patient.  The suicide prevention education provided includes the following:  Suicide risk factors  Suicide prevention and interventions  National Suicide Hotline telephone number  [facility] assessment telephone number  [facility] Emergency Assistance 911  [facility] and/or Residential Mobile Crisis Unit telephone number  Request made of family/significant other to:  Remove weapons (e.g., guns, rifles, knives), all items previously/currently identified as safety concern.    Remove drugs/medications (over-the-counter, prescriptions, illicit drugs), all items previously/currently identified as a safety concern.  The family member/significant other verbalizes understanding of the suicide prevention education information provided.  The family member/significant other agrees to remove the items of safety concern listed above.  Patient's husband states that he does not have any questions or concerns regarding the patient's discharge.   Maeola Sarah 06/13/2018, 10:14 AM

## 2018-06-13 NOTE — Tx Team (Signed)
Interdisciplinary Treatment and Diagnostic Plan Update  06/13/2018 Time of Session:  Kelsey Peters MRN: [id number]  Principal Diagnosis: MDD (major depressive disorder), recurrent severe, without psychosis (HCC)  Secondary Diagnoses: Principal Problem:   MDD (major depressive disorder), recurrent severe, without psychosis (HCC)   Current Medications:  Current Facility-Administered Medications  Medication Dose Route Frequency Provider Last Rate Last Dose  . acetaminophen (TYLENOL) tablet 650 mg  650 mg Oral Q6H PRN Laveda AbbeParks, Laurie Britton, NP      . alum & mag hydroxide-simeth (MAALOX/MYLANTA) 200-200-20 MG/5ML suspension 30 mL  30 mL Oral Q4H PRN Laveda AbbeParks, Laurie Britton, NP      . Melene Muller[START ON 06/14/2018] ferrous sulfate tablet 325 mg  325 mg Oral Q breakfast Cobos, Rockey SituFernando A, MD      . LORazepam (ATIVAN) tablet 0.5 mg  0.5 mg Oral Q8H PRN Cobos, Rockey SituFernando A, MD   0.5 mg at 06/12/18 2104  . magnesium hydroxide (MILK OF MAGNESIA) suspension 30 mL  30 mL Oral Daily PRN Laveda AbbeParks, Laurie Britton, NP      . metFORMIN (GLUCOPHAGE) tablet 1,000 mg  1,000 mg Oral BID WC Laveda AbbeParks, Laurie Britton, NP   1,000 mg at 06/13/18 [id number]  . traZODone (DESYREL) tablet 50 mg  50 mg Oral QHS PRN Laveda AbbeParks, Laurie Britton, NP   50 mg at 06/12/18 2104  . venlafaxine XR (EFFEXOR-XR) 24 hr capsule 150 mg  150 mg Oral Q breakfast Cobos, Rockey SituFernando A, MD   150 mg at 06/13/18 0807   PTA Medications: Medications Prior to Admission  Medication Sig Dispense Refill Last Dose  . metFORMIN (GLUCOPHAGE) 1000 MG tablet Take 1,000 mg by mouth 2 (two) times daily with a meal.   06/09/2018 at Unknown time  . [DISCONTINUED] LORazepam (ATIVAN) 0.5 MG tablet Take 0.25-0.5 mg by mouth 2 (two) times daily as needed for anxiety.   06/10/2018 at Unknown time    Patient Stressors:    Patient Strengths:    Treatment Modalities: Medication Management, Group therapy, Case management,  1 to 1 session with clinician, Psychoeducation, Recreational  therapy.   Physician Treatment Plan for Primary Diagnosis: MDD (major depressive disorder), recurrent severe, without psychosis (HCC) Long Term Goal(s): Improvement in symptoms so as ready for discharge Improvement in symptoms so as ready for discharge   Short Term Goals: Ability to identify changes in lifestyle to reduce recurrence of condition will improve Ability to verbalize feelings will improve Ability to disclose and discuss suicidal ideas Ability to demonstrate self-control will improve Ability to identify and develop effective coping behaviors will improve Ability to identify changes in lifestyle to reduce recurrence of condition will improve Ability to verbalize feelings will improve Ability to disclose and discuss suicidal ideas Ability to demonstrate self-control will improve Ability to identify and develop effective coping behaviors will improve Ability to maintain clinical measurements within normal limits will improve  Medication Management: Evaluate patient's response, side effects, and tolerance of medication regimen.  Therapeutic Interventions: 1 to 1 sessions, Unit Group sessions and Medication administration.  Evaluation of Outcomes: Adequate for Discharge  Physician Treatment Plan for Secondary Diagnosis: Principal Problem:   MDD (major depressive disorder), recurrent severe, without psychosis (HCC)  Long Term Goal(s): Improvement in symptoms so as ready for discharge Improvement in symptoms so as ready for discharge   Short Term Goals: Ability to identify changes in lifestyle to reduce recurrence of condition will improve Ability to verbalize feelings will improve Ability to disclose and discuss suicidal ideas Ability to demonstrate self-control will improve  Ability to identify and develop effective coping behaviors will improve Ability to identify changes in lifestyle to reduce recurrence of condition will improve Ability to verbalize feelings will  improve Ability to disclose and discuss suicidal ideas Ability to demonstrate self-control will improve Ability to identify and develop effective coping behaviors will improve Ability to maintain clinical measurements within normal limits will improve     Medication Management: Evaluate patient's response, side effects, and tolerance of medication regimen.  Therapeutic Interventions: 1 to 1 sessions, Unit Group sessions and Medication administration.  Evaluation of Outcomes: Adequate for Discharge   RN Treatment Plan for Primary Diagnosis: MDD (major depressive disorder), recurrent severe, without psychosis (HCC) Long Term Goal(s): Knowledge of disease and therapeutic regimen to maintain health will improve  Short Term Goals: Ability to participate in decision making will improve, Ability to verbalize feelings will improve, Ability to disclose and discuss suicidal ideas, Ability to identify and develop effective coping behaviors will improve and Compliance with prescribed medications will improve  Medication Management: RN will administer medications as ordered by provider, will assess and evaluate patient's response and provide education to patient for prescribed medication. RN will report any adverse and/or side effects to prescribing provider.  Therapeutic Interventions: 1 on 1 counseling sessions, Psychoeducation, Medication administration, Evaluate responses to treatment, Monitor vital signs and CBGs as ordered, Perform/monitor CIWA, COWS, AIMS and Fall Risk screenings as ordered, Perform wound care treatments as ordered.  Evaluation of Outcomes: Adequate for Discharge   LCSW Treatment Plan for Primary Diagnosis: MDD (major depressive disorder), recurrent severe, without psychosis (HCC) Long Term Goal(s): Safe transition to appropriate next level of care at discharge, Engage patient in therapeutic group addressing interpersonal concerns.  Short Term Goals: Engage patient in aftercare  planning with referrals and resources  Therapeutic Interventions: Assess for all discharge needs, 1 to 1 time with Social worker, Explore available resources and support systems, Assess for adequacy in community support network, Educate family and significant other(s) on suicide prevention, Complete Psychosocial Assessment, Interpersonal group therapy.  Evaluation of Outcomes: Adequate for Discharge   Progress in Treatment: Attending groups: Yes. Participating in groups: Yes. Taking medication as prescribed: Yes. Toleration medication: Yes. Family/Significant other contact made: Yes, individual(s) contacted:  the patient's husband Patient understands diagnosis: Yes. Discussing patient identified problems/goals with staff: Yes. Medical problems stabilized or resolved: Yes. Denies suicidal/homicidal ideation: Yes. Issues/concerns per patient self-inventory: No. Other:  New problem(s) identified: None   New Short Term/Long Term Goal(s): medication stabilization, elimination of SI thoughts, development of comprehensive mental wellness plan.    Patient Goals:    Discharge Plan or Barriers: Patient is discharging home with her husband and children. She plans to [facility] up at [facility] for medication management and therapy services.   Reason for Continuation of Hospitalization: None   Estimated Length of Stay: Discharging, 06/13/2018  Attendees: Patient: 06/13/2018 1:39 PM  Physician: Dr. Nehemiah Massed, MD 06/13/2018 1:39 PM  Nursing: Marchelle Folks.Salena Saner, RN 06/13/2018 1:39 PM  RN Care Manager: 06/13/2018 1:39 PM  Social Worker: Baldo Daub, LCSWA 06/13/2018 1:39 PM  Recreational Therapist:  06/13/2018 1:39 PM  Other: Beverly Gust, NP  06/13/2018 1:39 PM  Other:  06/13/2018 1:39 PM  Other: 06/13/2018 1:39 PM    Scribe for Treatment Team: Maeola Sarah, LCSWA 06/13/2018 1:39 PM

## 2018-06-13 NOTE — [facility] Suicide Risk Assessment (Signed)
[facility]   Principal Problem: MDD (major depressive disorder), recurrent severe, without psychosis (HCC) Discharge Diagnoses: Principal Problem:   MDD (major depressive disorder), recurrent severe, without psychosis (HCC)   Total Time spent with patient: 30 minutes  Musculoskeletal: Strength & Muscle Tone: within normal limits Gait & Station: normal Patient leans: N/A  Psychiatric Specialty Exam: ROS no headache, no chest pain, no shortness of breath, no vomiting , denies lightheadedness or dizziness  Blood pressure 116/76, pulse (!) 111, temperature 98.8 F (37.1 C), temperature source Oral, resp. rate 16, height 5\' 4"  (1.626 m), weight 69.9 kg, last menstrual period 06/06/2018, SpO2 99 %.Body mass index is 26.43 kg/m.  General Appearance: Well Groomed  Eye Contact::  Good  Speech:  Normal Rate409  Volume:  Normal  Mood:  improving mood, states she is feeling better  Affect:  Appropriate and fuller in range  Thought Process:  Linear and Descriptions of Associations: Intact  Orientation:  Full (Time, Place, and Person)  Thought Content:  no hallucinations, no delusions   Suicidal Thoughts:  No denies suicidal or self injurious ideations, denies any homicidal or violent ideations towards anyone  Homicidal Thoughts:  No  Memory:  recent and remote grossly intact  Judgement:  Other:  Improving  Insight:  Improving  Psychomotor Activity:  Normal  Concentration:  Good  Recall:  Good  Fund of Knowledge:Good  Language: Good  Akathisia:  Negative  Handed:  Right  AIMS (if indicated):     Assets:  Communication Skills Desire for Improvement Resilience  Sleep:  Number of Hours: 6.75  Cognition: WNL  ADL's:  Intact   Mental Status Per Nursing Assessment::   On Admission:  Self-harm behaviors  Demographic Factors:  32, married, two children, employed   Loss Factors: Job related stressors   Historical Factors: No prior psychiatric admissions,  no history of prior suicide attempts, no prior history of self injurious behaviors  Risk Reduction Factors:   Responsible for children under 35 years of age, Sense of responsibility to family, Living with another person, especially a relative and Positive coping skills or problem solving skills  Continued Clinical Symptoms:  Patient reports feeling a lot better than on admission.  Presents alert, attentive, well related, pleasant, good eye contact, mood improved and today seems euthymic, affect appropriate and full in range, no thought disorder, not suicidal or homicidal, denies any violent or homicidal ideations towards anybody, no hallucinations, no delusions, future oriented, looking forward to seeing her children and her husband and planning to resign from her job and return to college for further studies. Her on unit in good control, pleasant on approach. Denies medication side effects. Work-up consistent with iron deficiency anemia.  Patient aware.  She states she plans to start taking an iron supplement.  She also plans to follow-up with her PCP for further management and treatment.   Cognitive Features That Contribute To Risk:  No gross cognitive deficits noted upon discharge. Is alert , attentive, and oriented x 3     Suicide Risk:  Mild:  Suicidal ideation of limited frequency, intensity, duration, and specificity.  There are no identifiable plans, no associated intent, mild dysphoria and related symptoms, good self-control (both objective and subjective assessment), few other risk factors, and identifiable protective factors, including available and accessible social support.  Follow-up Information    [facility] on 06/24/2018.   Why:  Your therapy appointment with Leta JunglingJake is Tuesday, 1/21 at 9:30a.  Your medication  management appointment with Elon Jester is Wednesday, 1/22 at 9:30a. Please call within 24 hours after discharge to hold appointment and pay the $20 deposit.   Contact information: [address] [place] [address] [phone]           Plan Of Care/Follow-up recommendations:  Activity:  as tolerated  Diet:  regular Tests:  NA Other:  See below Patient is expressing readiness for discharge and is leaving unit in good spirits Follow up as above She has an established PCP Darryl Lent ) , for medical management as needed . Patient aware of iron deficiency anemia and plans to start iron supplement . She will follow up with her PCP regarding this issue. Craige Cotta, MD 06/13/2018, 10:32 AM

## 2018-06-13 NOTE — Progress Notes (Signed)
Recreation Therapy Notes  Date: 1.10.20 Time: 0930 Location: [address] Dayroom  Group Topic: Stress Management  Goal Area(s) Addresses:  Patient will identify ways to cope with stress.  Patient will identify stress management techniques. Patient will identify benefits of using stress management post d/c.  Intervention: Stress Management  Activity :  Meditation.  LRT introduced the stress management technique of meditation.  LRT played a meditation that allowed patients to focus on resiliency. Patients were to follow along as meditation played in order to engage in activity.  Education:  Stress Management, Discharge Planning.   Education Outcome: Acknowledges Education  Clinical Observations/Feedback: Pt did not attend group.    Kelsey Peters, LRT/CTRS         Pratham Cassatt A 06/13/2018 11:45 AM 

## 2018-06-13 NOTE — Progress Notes (Signed)
Patient ID: Kelsey Peters, female   DOB: 1985-07-17, 33 y.o.   MRN: [id number]  Pt discharged to lobby. Pt was stable and appreciative at that time. All papers and prescriptions were given and valuables returned. Verbal understanding expressed. Denies SI/HI and A/VH. Pt given opportunity to express concerns and ask questions.

## 2018-06-13 NOTE — Progress Notes (Signed)
Patient ID: Kelsey Peters, female   DOB: 05/31/1986, 33 y.o.   MRN: [id number]  Discharge Note  D) Patient discharged to lobby. Patient states readiness for discharge. Patient denies SI/HI, AVH and is not delusional or psychotic.   A) Written and verbal discharge instructions given to the patient. Patient accepting to information and verbalized understanding. Patient agrees to the discharge plan. Opportunity for questions and concerns presented to patient. Patient denied any further questions or concerns. All belongings returned to patient. Patient signed for return of belongings and discharge paperwork. Patient has completed their Suicide Safety Plan and has been provided Suicide Prevention Education. Patient provided an opportunity to complete and return Patient Satisfaction Survey.   R) Patient safely escorted to the lobby. Patient discharged from [facility] with prescriptions, personal belongings, follow-up appointment in place and discharge paperwork.

## 2019-08-15 ENCOUNTER — Ambulatory Visit: Payer: Managed Care, Other (non HMO) | Attending: Internal Medicine

## 2019-08-15 DIAGNOSIS — Z23 Encounter for immunization: Secondary | ICD-10-CM

## 2019-08-15 NOTE — Progress Notes (Signed)
   Covid-19 Vaccination Clinic  Name:  Kelsey Peters    MRN: [id number] DOB: 03-16-1986  08/15/2019  Ms. Kelsey Peters was observed post Covid-19 immunization for 15 minutes without incident. She was provided with Vaccine Information Sheet and instruction to access the V-Safe system.   Ms. Kelsey Peters was instructed to call 911 with any severe reactions post vaccine: Marland Kitchen Difficulty breathing  . Swelling of face and throat  . A fast heartbeat  . A bad rash all over body  . Dizziness and weakness   Immunizations Administered    Name Date Dose VIS Date Route   Pfizer COVID-19 Vaccine 08/15/2019  3:33 PM 0.3 mL 05/15/2019 Intramuscular   Manufacturer: [employer], [employer]   Lot: [id number]   NDC: [address]-7

## 2019-09-07 ENCOUNTER — Ambulatory Visit: Payer: Managed Care, Other (non HMO) | Attending: Internal Medicine

## 2019-09-07 DIAGNOSIS — Z23 Encounter for immunization: Secondary | ICD-10-CM

## 2019-09-07 NOTE — Progress Notes (Signed)
   Covid-19 Vaccination Clinic  Name:  Kelsey Peters    MRN: [id number] DOB: [birth year]  09/07/2019  Kelsey Peters was observed post Covid-19 immunization for 15 minutes without incident. She was provided with Vaccine Information Sheet and instruction to access the V-Safe system.   Kelsey Peters was instructed to call 911 with any severe reactions post vaccine: Marland Kitchen Difficulty breathing  . Swelling of face and throat  . A fast heartbeat  . A bad rash all over body  . Dizziness and weakness   Immunizations Administered    Name Date Dose VIS Date Route   Pfizer COVID-19 Vaccine 09/07/2019  8:33 AM 0.3 mL 05/15/2019 Intramuscular   Manufacturer: [employer], [employer]   Lot: [id number]   NDC: [address]-8

## 2019-09-08 ENCOUNTER — Ambulatory Visit: Payer: Self-pay

## 2020-03-19 ENCOUNTER — Ambulatory Visit: Payer: Self-pay | Attending: Internal Medicine

## 2020-03-19 DIAGNOSIS — Z23 Encounter for immunization: Secondary | ICD-10-CM

## 2020-03-19 NOTE — Progress Notes (Signed)
   Covid-19 Vaccination Clinic  Name:  Kelsey Peters    MRN: [id number] DOB: 1986/04/09  03/19/2020  Kelsey Peters was observed post Covid-19 immunization for 15 minutes without incident. She was provided with Vaccine Information Sheet and instruction to access the V-Safe system.   Kelsey Peters was instructed to call 911 with any severe reactions post vaccine: Marland Kitchen Difficulty breathing  . Swelling of face and throat  . A fast heartbeat  . A bad rash all over body  . Dizziness and weakness
